# Patient Record
Sex: Female | Born: 1995
Health system: Southern US, Community
[De-identification: ages and names within clinical notes are randomized; demographics above are authoritative.]

## PROBLEM LIST (undated history)

## (undated) DIAGNOSIS — G43909 Migraine, unspecified, not intractable, without status migrainosus: Secondary | ICD-10-CM

## (undated) HISTORY — DX: Migraine, unspecified, not intractable, without status migrainosus: G43.909

## (undated) HISTORY — PX: WISDOM TOOTH EXTRACTION: SHX21

---
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
## 2016-09-16 DIAGNOSIS — J988 Other specified respiratory disorders: Secondary | ICD-10-CM | POA: Diagnosis not present

## 2016-09-16 DIAGNOSIS — Z0000 Encounter for general adult medical examination without abnormal findings: Secondary | ICD-10-CM | POA: Diagnosis not present

## 2016-09-18 ENCOUNTER — Ambulatory Visit
Admission: RE | Admit: 2016-09-18 | Discharge: 2016-09-18 | Disposition: A | Discharge status: Home / Self Care | Payer: 59 | Source: Ambulatory Visit | Attending: Pediatrics | Admitting: Pediatrics

## 2016-09-18 ENCOUNTER — Other Ambulatory Visit: Payer: Self-pay | Admitting: Pediatrics

## 2016-09-18 DIAGNOSIS — R05 Cough: Secondary | ICD-10-CM

## 2016-09-18 DIAGNOSIS — R059 Cough, unspecified: Secondary | ICD-10-CM

## 2016-09-18 DIAGNOSIS — R918 Other nonspecific abnormal finding of lung field: Secondary | ICD-10-CM | POA: Diagnosis not present

## 2016-09-18 DIAGNOSIS — R509 Fever, unspecified: Secondary | ICD-10-CM

## 2016-09-18 DIAGNOSIS — J069 Acute upper respiratory infection, unspecified: Secondary | ICD-10-CM | POA: Diagnosis not present

## 2016-09-22 MED FILL — CLINDAMYCIN-BENZOYL PEROX 1: 1-5 | 25 days supply | Qty: 25 | Fill #0

## 2016-10-17 ENCOUNTER — Other Ambulatory Visit: Payer: Self-pay | Admitting: Pediatrics

## 2016-10-17 ENCOUNTER — Ambulatory Visit
Admission: RE | Admit: 2016-10-17 | Discharge: 2016-10-17 | Disposition: A | Discharge status: Home / Self Care | Payer: 59 | Source: Ambulatory Visit | Attending: Pediatrics | Admitting: Pediatrics

## 2016-10-17 DIAGNOSIS — Z09 Encounter for follow-up examination after completed treatment for conditions other than malignant neoplasm: Secondary | ICD-10-CM

## 2016-10-17 DIAGNOSIS — J1100 Influenza due to unidentified influenza virus with unspecified type of pneumonia: Secondary | ICD-10-CM

## 2016-10-17 DIAGNOSIS — Z23 Encounter for immunization: Secondary | ICD-10-CM | POA: Diagnosis not present

## 2016-10-17 DIAGNOSIS — J189 Pneumonia, unspecified organism: Secondary | ICD-10-CM | POA: Diagnosis not present

## 2017-01-31 DIAGNOSIS — L638 Other alopecia areata: Secondary | ICD-10-CM | POA: Diagnosis not present

## 2017-01-31 DIAGNOSIS — L648 Other androgenic alopecia: Secondary | ICD-10-CM | POA: Diagnosis not present

## 2017-02-06 MED FILL — CLOBETASOL 0.05% SOLUTION: 0.05 | 20 days supply | Qty: 50 | Fill #0

## 2017-03-05 MED FILL — CLOBETASOL 0.05% SOLUTION: 0.05 | 20 days supply | Qty: 50 | Fill #1

## 2017-03-06 DIAGNOSIS — L638 Other alopecia areata: Secondary | ICD-10-CM | POA: Diagnosis not present

## 2017-04-03 MED FILL — CLOBETASOL 0.05% SOLUTION: 0.05 | 20 days supply | Qty: 50 | Fill #2

## 2017-04-06 DIAGNOSIS — L700 Acne vulgaris: Secondary | ICD-10-CM | POA: Diagnosis not present

## 2017-04-06 DIAGNOSIS — L638 Other alopecia areata: Secondary | ICD-10-CM | POA: Diagnosis not present

## 2017-04-06 MED FILL — ACZONE 7.5% GEL PUMP: 7.5 | 60 days supply | Qty: 60 | Fill #0

## 2017-08-03 DIAGNOSIS — L700 Acne vulgaris: Secondary | ICD-10-CM | POA: Diagnosis not present

## 2017-08-03 DIAGNOSIS — L659 Nonscarring hair loss, unspecified: Secondary | ICD-10-CM | POA: Diagnosis not present

## 2018-04-26 DIAGNOSIS — Z7189 Other specified counseling: Secondary | ICD-10-CM | POA: Diagnosis not present

## 2018-04-26 DIAGNOSIS — Z00129 Encounter for routine child health examination without abnormal findings: Secondary | ICD-10-CM | POA: Diagnosis not present

## 2018-04-26 DIAGNOSIS — Z713 Dietary counseling and surveillance: Secondary | ICD-10-CM | POA: Diagnosis not present

## 2018-12-10 DIAGNOSIS — Z124 Encounter for screening for malignant neoplasm of cervix: Secondary | ICD-10-CM | POA: Diagnosis not present

## 2018-12-10 DIAGNOSIS — Z01419 Encounter for gynecological examination (general) (routine) without abnormal findings: Secondary | ICD-10-CM | POA: Diagnosis not present

## 2019-01-09 ENCOUNTER — Telehealth (INDEPENDENT_AMBULATORY_CARE_PROVIDER_SITE_OTHER): Payer: Self-pay | Admitting: Pediatrics

## 2019-01-09 NOTE — Telephone Encounter (Signed)
°  Who's calling (name and relationship to patient) : Lindsay Copeland  Best contact number: [phone]  Provider they see: Dr. Sharene Skeans  Reason for call: Patient states she needs [place] to send her medical records to [facility], since she aged out of our office. I stated to her that she needed to complete a Release of Information document, and patient stated that she is away at school so would like it emailed to her. I agreed to email an empty form to her, at [email or web address] and asked her to fax back the completed form to [place]. Once received told patient we would fax her records to [phone].   PRESCRIPTION REFILL ONLY  Name of prescription:  Pharmacy:

## 2019-01-21 MED FILL — CEPHALEXIN 500 MG CAPSULE: 500 | 5 days supply | Qty: 15 | Fill #0

## 2019-02-25 MED FILL — BLISOVI FE 1/20 1-20 MG-MCG: 1-20 | 84 days supply | Qty: 84 | Fill #0

## 2019-04-16 NOTE — Telephone Encounter (Signed)
error 

## 2019-05-28 MED FILL — BLISOVI FE 1/20 1-20 MG-MCG: 1-20 | 84 days supply | Qty: 84 | Fill #1

## 2019-08-13 ENCOUNTER — Telehealth (INDEPENDENT_AMBULATORY_CARE_PROVIDER_SITE_OTHER): Payer: Self-pay | Admitting: Pediatrics

## 2019-08-13 DIAGNOSIS — G43009 Migraine without aura, not intractable, without status migrainosus: Secondary | ICD-10-CM

## 2019-08-13 MED FILL — BLISOVI FE 1/20 1-20 MG-MCG: 1-20 | 84 days supply | Qty: 84 | Fill #2

## 2019-08-13 NOTE — Telephone Encounter (Signed)
We have no records in the chart for this young woman which means that I have not seen her since March 2013.  She has migraine without aura.  She needs neurological evaluation and treatment.  I told her to call me back if there is any problem with this referral.  I spoke with someone in [place]'s office to let them know that we have no records to send them and that she just needs to be seen as a new patient with migraines.

## 2019-08-13 NOTE — Telephone Encounter (Signed)
°  Who's calling (name and relationship to patient) : Onesty (patient)  Best contact number: [phone]  Provider they see: Gaynell Face   Reason for call: Patient called and need a referral sent to [facility] ph number [phone] to transfer care.  Her ROI was sent in March.      PRESCRIPTION REFILL ONLY  Name of prescription:  Pharmacy:

## 2019-08-13 NOTE — Telephone Encounter (Signed)
Referral was sent in March. They tried to contact patient was unable to reach her

## 2019-08-20 ENCOUNTER — Encounter: Payer: Self-pay | Admitting: Neurology

## 2019-08-30 ENCOUNTER — Emergency Department (HOSPITAL_COMMUNITY): Payer: 59

## 2019-08-30 ENCOUNTER — Encounter (HOSPITAL_COMMUNITY): Payer: Self-pay

## 2019-08-30 ENCOUNTER — Emergency Department (HOSPITAL_COMMUNITY)
Admission: EM | Admit: 2019-08-30 | Discharge: 2019-08-30 | Disposition: A | Discharge status: Home / Self Care | Payer: 59 | Attending: Emergency Medicine | Admitting: Emergency Medicine

## 2019-08-30 ENCOUNTER — Other Ambulatory Visit: Payer: Self-pay

## 2019-08-30 DIAGNOSIS — R519 Headache, unspecified: Secondary | ICD-10-CM | POA: Diagnosis not present

## 2019-08-30 DIAGNOSIS — G43109 Migraine with aura, not intractable, without status migrainosus: Secondary | ICD-10-CM | POA: Diagnosis not present

## 2019-08-30 DIAGNOSIS — R200 Anesthesia of skin: Secondary | ICD-10-CM | POA: Diagnosis not present

## 2019-08-30 DIAGNOSIS — G43909 Migraine, unspecified, not intractable, without status migrainosus: Secondary | ICD-10-CM | POA: Diagnosis present

## 2019-08-30 LAB — CBC
HCT: 42 % (ref 36.0–46.0)
Hemoglobin: 13.9 g/dL (ref 12.0–15.0)
MCH: 30.2 pg (ref 26.0–34.0)
MCHC: 33.1 g/dL (ref 30.0–36.0)
MCV: 91.3 fL (ref 80.0–100.0)
Platelets: 285 10*3/uL (ref 150–400)
RBC: 4.6 MIL/uL (ref 3.87–5.11)
RDW: 11.4 % — ABNORMAL LOW (ref 11.5–15.5)
WBC: 13.7 10*3/uL — ABNORMAL HIGH (ref 4.0–10.5)
nRBC: 0 % (ref 0.0–0.2)

## 2019-08-30 LAB — BASIC METABOLIC PANEL
Anion gap: 14 (ref 5–15)
BUN: 13 mg/dL (ref 6–20)
CO2: 21 mmol/L — ABNORMAL LOW (ref 22–32)
Calcium: 9.4 mg/dL (ref 8.9–10.3)
Chloride: 103 mmol/L (ref 98–111)
Creatinine, Ser: 0.89 mg/dL (ref 0.44–1.00)
GFR calc Af Amer: >60 mL/min (ref >60)
GFR calc non Af Amer: >60 mL/min (ref >60)
Glucose, Bld: 105 mg/dL — ABNORMAL HIGH (ref 70–99)
Potassium: 3.6 mmol/L (ref 3.5–5.1)
Sodium: 138 mmol/L (ref 135–145)

## 2019-08-30 LAB — I-STAT BETA HCG BLOOD, ED (MC, WL, AP ONLY): I-stat hCG, quantitative: <5 m[IU]/mL (ref <5)

## 2019-08-30 MED ORDER — TOPIRAMATE 25 MG PO TABS: 25.0000 mg | ORAL_TABLET | Freq: Two times a day (BID) | ORAL | 0 refills | Status: DC

## 2019-08-30 MED ORDER — GADOBUTROL 1 MMOL/ML IV SOLN
5.0000 mL | Freq: Once | INTRAVENOUS | Status: AC | PRN
  Administered 2019-08-30: 5 mL via INTRAVENOUS

## 2019-08-30 MED ORDER — METOCLOPRAMIDE HCL 5 MG/ML IJ SOLN
10.0000 mg | INTRAMUSCULAR | Status: AC
  Administered 2019-08-30: 07:00:00 10 mg via INTRAVENOUS
  Filled 2019-08-30: qty 2

## 2019-08-30 MED ORDER — SODIUM CHLORIDE 0.9 % IV BOLUS
1000.0000 mL | Freq: Once | INTRAVENOUS | Status: AC
  Administered 2019-08-30: 06:00:00 1000 mL via INTRAVENOUS

## 2019-08-30 MED FILL — TOPIRAMATE 25 MG TABLET: 25 | 20 days supply | Qty: 40 | Fill #0

## 2019-08-30 NOTE — ED Triage Notes (Addendum)
Pt arrives to ED with c/o of migraines. Pt states she has has not had migraines in 4-5 years and has had about 10 in past year. 3 migraines in past 9 days. Has vision changes in both eyes. Confusion for approx 20 mins this am around 3. R arm numbness/heaviness. Pt states she could not figure out how to get around in her own home, but she knew she was in her house. Head currently hurting on both sides and has nausea w/ no vomiting. Pt currently taking birth control. Took 650mg  tylenol around 0315 and 2 magnilife migraine relief at same time.

## 2019-08-30 NOTE — ED Notes (Signed)
Patient transported to MRI 

## 2019-08-30 NOTE — ED Provider Notes (Signed)
[facility] EMERGENCY DEPARTMENT Provider Note   CSN: [id number] Arrival date & time: 08/30/19  8177     History   Chief Complaint Chief Complaint  Patient presents with   Migraine    HPI Lindsay Copeland is a 23 y.o. female.    23 year old female presents to the emergency department for evaluation of migraine headaches.  With regards to symptoms tonight, patient awoke at around 0315 for a headache.  She describes the pain as sharp.  It began on one side of her head, but she is unsure of which side.  Later became more global.  Initially took some Tylenol followed by over-the-counter holistic migraine relief tablets.  Pain was initially rated at 8/10.  She had some paresthesias and heaviness of her right hand and arm as well as some numbness of her lips.  Symptoms further associated with photophobia as well as nausea.  The patient became concerned when she had an episode of confusion which lasted approximately 15 minutes.  She states that she was unaware of where she was in her house; had trouble placing her surroundings.  She currently states that her headache has improved and rates it at 2/10.  She is no longer acutely confused.  She does have a history of migraines which were evaluated as early as 2012.  Her symptoms resolved for 4 to 5 years and began to recur in January.  She is pending follow-up with neurology this coming week.  States that it is not unusual for her to have an aura with her migraines.  She states that proceeding symptoms typically include numbness in her extremities and face with vision changes.  Her vision changes were initially characterized as double vision, but she has had a few episodes over the past week where she experiences homonymous hemianopsia.  This can be either left or right sided.  She has not had episodes of confusion with headaches in the past.  No recent head injury or trauma, fevers, vomiting, numbness or tingling of the lower  extremities, extremity weakness, complete vision loss, tinnitus or hearing loss, facial drooping, slurred speech.  Began taking OCPs in February.   The history is provided by the patient. No language interpreter was used.  Migraine    History reviewed. No pertinent past medical history.  There are no active problems to display for this patient.   ** The histories are not reviewed yet. Please review them in the "History" navigator section and refresh this SmartLink.   OB History   No obstetric history on file.      Home Medications    Prior to Admission medications   Not on File    Family History History reviewed. No pertinent family history.  Social History Social History   Tobacco Use   Smoking status: Not on file  Substance Use Topics   Alcohol use: Not on file   Drug use: Not on file     Allergies   Patient has no known allergies.   Review of Systems Review of Systems Ten systems reviewed and are negative for acute change, except as noted in the HPI.    Physical Exam Updated Vital Signs BP (!) 167/98 (BP Location: Left Arm)    Pulse (!) 102    Temp 97.9 F (36.6 C) (Oral)    Resp (!) 21    Wt 47.3 kg    LMP 08/15/2019 (Exact Date)    SpO2 100%   Physical Exam Vitals signs and nursing  note reviewed.  Constitutional:      General: She is not in acute distress.    Appearance: She is well-developed. She is not diaphoretic.     Comments: Thin frame. Nontoxic appearing  HENT:     Head: Normocephalic and atraumatic.     Mouth/Throat:     Mouth: Mucous membranes are moist.     Comments: Symmetric rise of the uvula with phonation Eyes:     General: No scleral icterus.    Extraocular Movements: Extraocular movements intact.     Conjunctiva/sclera: Conjunctivae normal.     Pupils: Pupils are equal, round, and reactive to light.     Comments: No nystagmus. No visual field deficits.  Neck:     Musculoskeletal: Normal range of motion.  Pulmonary:      Effort: Pulmonary effort is normal. No respiratory distress.     Comments: Respirations even and unlabored Musculoskeletal: Normal range of motion.  Skin:    General: Skin is warm and dry.     Coloration: Skin is not pale.     Findings: No erythema or rash.  Neurological:     Mental Status: She is alert and oriented to person, place, and time.     Comments: GCS 15. Speech is goal oriented. No cranial nerve deficits appreciated; symmetric eyebrow raise, no facial drooping, tongue midline. Patient has equal grip strength bilaterally with 5/5 strength against resistance in all major muscle groups bilaterally. Sensation to light touch intact. Patient moves extremities without ataxia. Normal finger-nose-finger.   Psychiatric:        Behavior: Behavior normal.      ED Treatments / Results  Labs (all labs ordered are listed, but only abnormal results are displayed) Labs Reviewed  BASIC METABOLIC PANEL  CBC  I-STAT BETA HCG BLOOD, ED ([facility], [facility], [facility] ONLY)    EKG None  Radiology No results found.  Procedures Procedures (including critical care time)  Medications Ordered in ED Medications  metoCLOPramide (REGLAN) injection 10 mg (has no administration in time range)  sodium chloride 0.9 % bolus 1,000 mL (1,000 mLs Intravenous New Bag/Given 08/30/19 0615)     Initial Impression / Assessment and Plan / ED Course  I have reviewed the triage vital signs and the nursing notes.  Pertinent labs & imaging results that were available during my care of the patient were reviewed by me and considered in my medical decision making (see chart for details).        36:85 AM 23 year old female presents to the emergency department for evaluation of migraine headaches.  Headache has improved since taking over-the-counter medications.  Present neurologic exam is nonfocal.  Patient with associated numbness in her face and lips tonight as well as her right arm and hand with "heaviness".  Patient also  reporting associated photophobia and nausea.  Reports an episode of confusion as well lasting ~15-20 minutes.  The patient has been experiencing recurrent headaches since January with 5 in the past 10 days.  Patient reporting an aura with her migraines which typically includes paresthesias as well as visual field defects.  Upon chart review, patient had MRI in 2012 concerning for possible MS.  Upon speaking with patient and her mother, this was not further investigated as the patient's headaches resolved for a number of years.  She has plans to reestablish care with neurology and an appointment this coming week.  Plan for labs and imaging for further evaluation of symptoms given new onset of acute confusion with migraine tonight.  6:15 AM Case discussed with Dr. Laurence SlateAroor on-call for neurology.  He recommends foregoing CT for MRI with and without contrast given prior imaging readings.  Also recommends that the patient be on some migraine preventative medication given her frequency and preceding aura; suggests Topamax if imaging reassuring.  Patient to be signed out to HardestyRobinson, NP at shift change pending MRI results.   Final Clinical Impressions(s) / ED Diagnoses   Final diagnoses:  Bad headache    ED Discharge Orders    None       Antony MaduraHumes, Chellie Vanlue, PA-C 08/30/19 [id number]    Dione BoozeGlick, David, MD 08/30/19 [phone]

## 2019-08-30 NOTE — ED Notes (Signed)
Pt given sprite and crackers  

## 2019-08-30 NOTE — ED Notes (Signed)
MRI states that the pt should be able to get her test done in hopefully another hour.

## 2019-08-30 NOTE — ED Notes (Signed)
Pt states that the vision changes and the numbness have subsided. Rates pain 1/10. Pt removed clothing and put on hospital gown. Denies any claustrophobia.

## 2019-08-30 NOTE — ED Notes (Signed)
Pt placed on continuous pulse ox

## 2019-08-30 NOTE — ED Notes (Signed)
ED Provider at bedside. 

## 2019-08-30 NOTE — ED Notes (Signed)
Pt up to restroom.

## 2019-09-12 MED FILL — JUNEL FE 1.5/30 TABLET: 1.5-30 | 84 days supply | Qty: 84 | Fill #0

## 2019-09-16 NOTE — Progress Notes (Addendum)
NEUROLOGY CONSULTATION NOTE  Lindsay Copeland MRN: [id number] DOB: Oct 11, 1996  Referring provider: Ellison CarwinWilliam Hickling, MD Primary care provider: No PCP  Reason for consult:  migraines  HISTORY OF PRESENT ILLNESS: Lindsay Copeland is a 23 year old female who presents for migraines.  History supplemented by ED note.  Accompanied by mother via speaker phone  She started experiencing migraines when she was in middle school.  Typically they have been preceded by numbness and tingling of her face and extremities as well as double vision.  Headaches are associated with nausea, vomiting, photophobia, phonophobia and unilateral weakness.  They subsequently resolved in 11th grade.    However, they started to recur in January 2020.  She had one migraine in January.  She started birth control (Blisovi Fe) in February.  Now, she doesn't have double vision but she has now been having blind spots in either eye or flashing spots and zigzag lines for 30-45 minutes prior to headache onset.  It now appears to have dysconjugate gaze (one eye appears not straight).  They typically have been lasting several hours (usually goes to sleep) and has occurred 14 times this past year (most frequent in October - 6 times).  She has a postdrome lasting 1 to 2 days described as head pressure.  [facility] after waking up in the middle of the night with a migraine.  This migraine was different, as it was accompanied by 15 minutes of confusion in which she felt disoriented in her house. MRI of brain and cervical spine with and without contrast were personally reviewed and demonstrated periventricular T2 hyperintensities in the cerebral white matter, stable compared to prior imaging from 2012, and no abnormalities of the cervical spine.  She was treated in the ED with IV Reglan and discharged on topiramate. After headache broke, she had trouble with concentration, processing thoughts and  word-finding difficulty for 2 weeks and still not back to baseline. 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  She passed her clinicals but now studying for her boards.  She hasn't had a migraine since the ED visit but has has reported dysconjugate gaze.  Triggers:  Only one headache associated with menses. Relieving factors:  sleep  Current NSAIDS:  ibuprofen Current analgesics:  acetaminophen Current triptans:  none Current ergotamine:  none Current anti-emetic:  none Current muscle relaxants:  none Current anti-anxiolytic:  none Current sleep aide:  none Current Antihypertensive medications:  none Current Antidepressant medications:  none Current Anticonvulsant medications:  topiramate 25mg  twice daily. Current anti-CGRP:  none Current Vitamins/Herbal/Supplements:  Migraine relieve supplement combination PRN Current Antihistamines/Decongestants:  none Other therapy:  none Hormone/birth control:  Loestrin Fe (switched a couple of weeks ago)  Past NSAIDS:  none Past analgesics:  tramadol Past abortive triptans:  none Past abortive ergotamine:  none Past muscle relaxants:  none Past anti-emetic:  Zofran Past antihypertensive medications:  none Past antidepressant medications:  none Past anticonvulsant medications:  none Past anti-CGRP:  none Past vitamins/Herbal/Supplements:  none Past antihistamines/decongestants:  none Other past therapies:  none  Caffeine:  Coffee not daily Diet:  Needs to increase water intake Exercise:  Walker Depression:  no; Anxiety:  no Other pain:  no Sleep hygiene:  okay Family history of headache:  no  PAST MEDICAL HISTORY: History reviewed. No pertinent past medical history.   PAST SURGICAL HISTORY: History reviewed. No pertinent surgical history.  MEDICATIONS: Outpatient Encounter  Medications as of 09/19/2019  Medication Sig   norethindrone-ethinyl estradiol (LOESTRIN FE) 1-20 MG-MCG  tablet Take 1 tablet by mouth daily.   topiramate (TOPAMAX) 25 MG tablet Take 1 tablet (25 mg total) by mouth 2 (two) times daily.   [DISCONTINUED] BLISOVI FE 1/20 1-20 MG-MCG tablet    No facility-administered encounter medications on file as of 09/19/2019.     ALLERGIES: No Known Allergies  FAMILY HISTORY: Family History  Problem Relation Age of Onset   Healthy Mother    Bone cancer Father    Healthy Sister    Healthy Brother     SOCIAL HISTORY: Social History   Socioeconomic History   Marital status: Single    Spouse name: Not on file   Number of children: Not on file   Years of education: Not on file   Highest education level: Not on file  Occupational History   Not on file  Social Needs   Financial resource strain: Not on file   Food insecurity    Worry: Not on file    Inability: Not on file   Transportation needs    Medical: Not on file    Non-medical: Not on file  Tobacco Use   Smoking status: Not on file  Substance and Sexual Activity   Alcohol use: Not on file   Drug use: Not on file   Sexual activity: Not on file  Lifestyle   Physical activity    Days per week: Not on file    Minutes per session: Not on file   Stress: Not on file  Relationships   Social connections    Talks on phone: Not on file    Gets together: Not on file    Attends religious service: Not on file    Active member of club or organization: Not on file    Attends meetings of clubs or organizations: Not on file    Relationship status: Not on file   Intimate partner violence    Fear of current or ex partner: Not on file    Emotionally abused: Not on file    Physically abused: Not on file    Forced sexual activity: Not on file  Other Topics Concern   Not on file  Social History Narrative   Not on file    REVIEW OF SYSTEMS: Constitutional: No fevers, chills, or sweats, no generalized fatigue, change in appetite Eyes: No visual changes, double vision,  eye pain Ear, nose and throat: No hearing loss, ear pain, nasal congestion, sore throat Cardiovascular: No chest pain, palpitations Respiratory:  No shortness of breath at rest or with exertion, wheezes GastrointestinaI: No nausea, vomiting, diarrhea, abdominal pain, fecal incontinence Genitourinary:  No dysuria, urinary retention or frequency Musculoskeletal:  No neck pain, back pain Integumentary: No rash, pruritus, skin lesions Neurological: as above Psychiatric: No depression, insomnia, anxiety Endocrine: No palpitations, fatigue, diaphoresis, mood swings, change in appetite, change in weight, increased thirst Hematologic/Lymphatic:  No purpura, petechiae. Allergic/Immunologic: no itchy/runny eyes, nasal congestion, recent allergic reactions, rashes  PHYSICAL EXAM: Blood pressure (!) 174/94, pulse (!) 118, height 5\' 7"  (1.702 m), weight 100 lb (45.4 kg), SpO2 100 %. General: No acute distress.  Patient appears well-groomed.   Head:  Normocephalic/atraumatic Eyes:  fundi examined but not visualized Neck: supple, no paraspinal tenderness, full range of motion Back: No paraspinal tenderness Heart: regular rate and rhythm Lungs: Clear to auscultation bilaterally. Vascular: No carotid bruits. Neurological Exam: Mental status: alert and oriented to person,  place, and time, recent and remote memory intact, fund of knowledge intact, attention and concentration intact, speech fluent and not dysarthric, language intact. Cranial nerves: CN I: not tested CN II: pupils equal, round and reactive to light, visual fields intact CN III, IV, VI:  full range of motion, no nystagmus, no ptosis CN V: facial sensation intact CN VII: upper and lower face symmetric CN VIII: hearing intact CN IX, X: gag intact, uvula midline CN XI: sternocleidomastoid and trapezius muscles intact CN XII: tongue midline Bulk & Tone: normal, no fasciculations. Motor:  5/5 throughout  Sensation:  temperature and  vibration sensation intact. Deep Tendon Reflexes:  2+ throughout, toes downgoing.   Finger to nose testing:  Without dysmetria.   Heel to shin:  Without dysmetria.   Gait:  Normal station and stride.  Able to turn and tandem walk. Romberg negative.  IMPRESSION: Migraine with aura, without status migrainosus, not intractable Hemiplegic migraine Still notes some mild difficulty with concentration, which may be secondary to topiramate. Sinus tachycardia and elevated blood pressure  PLAN: 1.  For preventative management, topiramate 25mg  twice daily 2.  For abortive therapy, Nurtec 75mg .  Triptans are contraindicated given stroke-like symptoms of her migraines.  Zofran for nausea. 3.  Limit use of pain relievers to no more than 2 days out of week to prevent risk of rebound or medication-overuse headache. 4.  Keep headache diary 5.  Exercise, hydration, caffeine cessation, sleep hygiene, monitor for and avoid triggers 6.  Consider:  magnesium citrate 400mg  daily, riboflavin 400mg  daily, and coenzyme Q10 100mg  three times daily 7. Always keep in mind that currently taking a hormone or birth control may be a possible trigger or aggravating factor for migraine. 8. Advised to follow up with PCP regarding pulse and blood pressure 9. Follow up 4 months   Thank you for allowing me to take part in the care of this patient.  , DO

## 2019-09-19 ENCOUNTER — Encounter: Payer: Self-pay | Admitting: Neurology

## 2019-09-19 ENCOUNTER — Telehealth: Payer: Self-pay | Admitting: Neurology

## 2019-09-19 ENCOUNTER — Ambulatory Visit (INDEPENDENT_AMBULATORY_CARE_PROVIDER_SITE_OTHER): Payer: 59 | Admitting: Neurology

## 2019-09-19 ENCOUNTER — Other Ambulatory Visit: Payer: Self-pay

## 2019-09-19 VITALS — BP 174/94 | HR 118 | Ht 67.0 in | Wt 100.0 lb

## 2019-09-19 DIAGNOSIS — G43119 Migraine with aura, intractable, without status migrainosus: Secondary | ICD-10-CM | POA: Diagnosis not present

## 2019-09-19 DIAGNOSIS — R030 Elevated blood-pressure reading, without diagnosis of hypertension: Secondary | ICD-10-CM

## 2019-09-19 DIAGNOSIS — R000 Tachycardia, unspecified: Secondary | ICD-10-CM

## 2019-09-19 MED ORDER — NURTEC 75 MG PO TBDP: 1.0000 | ORAL_TABLET | Freq: Every day | ORAL | 0 refills | Status: DC | PRN

## 2019-09-19 MED ORDER — TOPIRAMATE 25 MG PO TABS: 25.0000 mg | ORAL_TABLET | Freq: Two times a day (BID) | ORAL | 3 refills | Status: DC

## 2019-09-19 MED ORDER — ONDANSETRON 4 MG PO TBDP: 4.0000 mg | ORAL_TABLET | Freq: Three times a day (TID) | ORAL | 3 refills | Status: DC | PRN

## 2019-09-19 MED FILL — ONDANSETRON ODT 4 MG TABLET: 4 | 7 days supply | Qty: 20 | Fill #0

## 2019-09-19 MED FILL — TOPIRAMATE 25 MG TABLET: 25 | 30 days supply | Qty: 60 | Fill #0

## 2019-09-19 NOTE — Telephone Encounter (Signed)
Pt aware no change continue same dose And that Rx was sent to pharmacy  Update phone number  [phone]

## 2019-09-19 NOTE — Addendum Note (Signed)
Addended byTomi Likens, ADAM R on: 09/19/2019 11:30 AM   Modules accepted: Orders

## 2019-09-19 NOTE — Telephone Encounter (Signed)
Per office note from today take topiramate 25 mg at bedtime.  Decrease dose and send to pharmacy? Patient is requesting refills sent to local pharmacy

## 2019-09-19 NOTE — Telephone Encounter (Signed)
I sent refill to pharmacy (25mg  twice daily)

## 2019-09-19 NOTE — Patient Instructions (Signed)
1.  Take topiramate 25mg  at bedtime 2.  At earliest onset of migraine, take Nurtec. No more than one tablet in 24 hours 3.  Take ondansetron for nausea 4.  Limit use of pain relievers to no more than 2 days out of week to prevent risk of rebound or medication-overuse headache. 5.  Keep headache diary 6.  Follow up with PCP regarding blood pressure and heart rate 7.  Follow up in 4 months.

## 2019-09-19 NOTE — Telephone Encounter (Signed)
No change in dose.  Continue 25mg  twice daily.

## 2019-09-19 NOTE — Telephone Encounter (Signed)
Patient was seen today but has questions about her topomax 25 MG prescription. She said she has been taking it twice a day but she thought she heard Dr. Tomi Likens say she will now be taking it once a day.  [place]

## 2019-10-07 ENCOUNTER — Other Ambulatory Visit: Payer: Self-pay

## 2019-10-07 DIAGNOSIS — Z20822 Contact with and (suspected) exposure to covid-19: Secondary | ICD-10-CM

## 2019-10-09 LAB — NOVEL CORONAVIRUS, NAA: SARS-CoV-2, NAA: NOT DETECTED

## 2019-10-17 MED FILL — TOPIRAMATE 25 MG TABLET: 25 | 30 days supply | Qty: 60 | Fill #1

## 2019-11-03 DIAGNOSIS — R509 Fever, unspecified: Secondary | ICD-10-CM | POA: Diagnosis not present

## 2019-11-03 DIAGNOSIS — R05 Cough: Secondary | ICD-10-CM | POA: Diagnosis not present

## 2019-11-03 DIAGNOSIS — Z20828 Contact with and (suspected) exposure to other viral communicable diseases: Secondary | ICD-10-CM | POA: Diagnosis not present

## 2019-11-24 MED FILL — TOPIRAMATE 25 MG TABLET: 25 | 30 days supply | Qty: 60 | Fill #2

## 2019-12-10 MED FILL — JUNEL FE 1.5/30 TABLET: 1.5-30 | 84 days supply | Qty: 84 | Fill #1

## 2020-01-30 MED FILL — TOPIRAMATE 25 MG TABLET: 25 | 30 days supply | Qty: 60 | Fill #3

## 2020-02-02 NOTE — Progress Notes (Signed)
NEUROLOGY FOLLOW UP OFFICE NOTE  Lindsay Copeland [id number]  HISTORY OF PRESENT ILLNESS: Lindsay Copeland is a 24 year old female who follows up for migraines.  UPDATE: Since last visit in November, she has had only one migraine, preceded by visual aura.  She took a Nurtec and the headache resolved after a couple of hours.  No postdrome fatigue.  About 2 months ago, she started taking only the bedtime dose of topiramate because she just kept forgetting to take the morning dose.  No headache recurrence.  No hemiplegic migraine  Current NSAIDS:  ibuprofen Current analgesics:  acetaminophen Current triptans:  none Current ergotamine:  none Current anti-emetic:  none Current muscle relaxants:  none Current anti-anxiolytic:  none Current sleep aide:  none Current Antihypertensive medications:  none Current Antidepressant medications:  none Current Anticonvulsant medications:  topiramate 25mg  at bedtime. Current anti-CGRP:  Nurtec Current Vitamins/Herbal/Supplements:  Migraine relieve supplement combination PRN Current Antihistamines/Decongestants:  none Other therapy:  none Hormone/birth control:  Junel Fe  Caffeine:  Coffee not daily Diet:  Needs to increase water intake Exercise:  Walker Depression:  no; Anxiety:  no Other pain:  no Sleep hygiene:  okay  HISTORY: She started experiencing migraines when she was in middle school.  Typically they have been preceded by numbness and tingling of her face and extremities as well as double vision.  Headaches are associated with nausea, vomiting, photophobia, phonophobia and unilateral weakness.  They subsequently resolved in 11th grade.    However, they started to recur in January 2020.  She had one migraine in January.  She started birth control (Blisovi Fe) in February.  Now, she doesn't have double vision but she has now been having blind spots in either eye or flashing spots and zigzag lines for 30-45 minutes prior to  headache onset.  It now appears to have dysconjugate gaze (one eye appears not straight).  They typically have been lasting several hours (usually goes to sleep) and has occurred 14 times this past year (most frequent in October - 6 times).  She has a postdrome lasting 1 to 2 days described as head pressure.  She presented to the [facility] ED on 08/30/2019 after waking up in the middle of the night with a migraine.  This migraine was different, as it was accompanied by 15 minutes of confusion in which she felt disoriented in her house. MRI of brain and cervical spine with and without contrast were personally reviewed and demonstrated periventricular T2 hyperintensities in the cerebral white matter, stable compared to prior imaging from 2012, and no abnormalities of the cervical spine.  She was treated in the ED with IV Reglan and discharged on topiramate. After headache broke, she had trouble with concentration, processing thoughts and word-finding difficulty for 2 weeks and still not back to baseline. 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  She passed her clinicals but now studying for her boards.  She hasn't had a migraine since the ED visit but has has reported dysconjugate gaze.  Triggers:  Only one headache associated with menses. Relieving factors:  sleep  Past NSAIDS:  none Past analgesics:  tramadol Past abortive triptans:  none Past abortive ergotamine:  none Past muscle relaxants:  none Past anti-emetic:  Zofran Past antihypertensive medications:  none Past antidepressant medications:  none Past anticonvulsant medications:  none Past anti-CGRP:  none Past vitamins/Herbal/Supplements:  none Past antihistamines/decongestants:  none Other past therapies:  none   Family history of headache:  no  PAST MEDICAL HISTORY: No past medical history on file.  MEDICATIONS: Current Outpatient Medications on File Prior to Visit  Medication  Sig Dispense Refill  . norethindrone-ethinyl estradiol (LOESTRIN FE) 1-20 MG-MCG tablet Take 1 tablet by mouth daily.    . ondansetron (ZOFRAN ODT) 4 MG disintegrating tablet Take 1 tablet (4 mg total) by mouth every 8 (eight) hours as needed for nausea or vomiting. 20 tablet 3  . Rimegepant Sulfate (NURTEC) 75 MG TBDP Take 1 tablet by mouth daily as needed. 3 tablet 0  . topiramate (TOPAMAX) 25 MG tablet Take 1 tablet (25 mg total) by mouth 2 (two) times daily. 60 tablet 3   No current facility-administered medications on file prior to visit.    ALLERGIES: No Known Allergies  FAMILY HISTORY: Family History  Problem Relation Age of Onset  . Healthy Mother   . Bone cancer Father   . Healthy Sister   . Healthy Brother     SOCIAL HISTORY: Social History   Socioeconomic History  . Marital status: Married    Spouse name: Not on file  . Number of children: 0  . Years of education: Not on file  . Highest education level: Bachelor's degree (e.g., BA, AB, BS)  Occupational History  . Not on file  Tobacco Use  . Smoking status: Never Smoker  . Smokeless tobacco: Never Used  Substance and Sexual Activity  . Alcohol use: Never  . Drug use: Never  . Sexual activity: Not on file  Other Topics Concern  . Not on file  Social History Narrative  . Not on file   Social Determinants of Health   Financial Resource Strain:   . Difficulty of Paying Living Expenses:   Food Insecurity:   . Worried About Programme researcher, broadcasting/film/video in the Last Year:   . Barista in the Last Year:   Transportation Needs:   . Freight forwarder (Medical):   Marland Kitchen Lack of Transportation (Non-Medical):   Physical Activity:   . Days of Exercise per Week:   . Minutes of Exercise per Session:   Stress:   . Feeling of Stress :   Social Connections:   . Frequency of Communication with Friends and Family:   . Frequency of Social Gatherings with Friends and Family:   . Attends Religious Services:   . Active  Member of Clubs or Organizations:   . Attends Banker Meetings:   Marland Kitchen Marital Status:   Intimate Partner Violence:   . Fear of Current or Ex-Partner:   . Emotionally Abused:   Marland Kitchen Physically Abused:   . Sexually Abused:     REVIEW OF SYSTEMS: Constitutional: No fevers, chills, or sweats, no generalized fatigue, change in appetite Eyes: No visual changes, double vision, eye pain Ear, nose and throat: No hearing loss, ear pain, nasal congestion, sore throat Cardiovascular: No chest pain, palpitations Respiratory:  No shortness of breath at rest or with exertion, wheezes GastrointestinaI: No nausea, vomiting, diarrhea, abdominal pain, fecal incontinence Genitourinary:  No dysuria, urinary retention or frequency Musculoskeletal:  No neck pain, back pain Integumentary: No rash, pruritus, skin lesions Neurological: as above Psychiatric: No depression, insomnia, anxiety Endocrine: No palpitations, fatigue, diaphoresis, mood swings, change in appetite, change in weight, increased thirst Hematologic/Lymphatic:  No purpura, petechiae. Allergic/Immunologic: no itchy/runny eyes, nasal congestion, recent allergic reactions, rashes  PHYSICAL EXAM: Blood pressure 124/81, pulse (!) 102, height 5\' 7"  (1.702  m), weight 97 lb (44 kg), SpO2 98 %. General: No acute distress.  Patient appears well-groomed.   Head:  Normocephalic/atraumatic Eyes:  Fundi examined but not visualized Neck: supple, no paraspinal tenderness, full range of motion Heart:  Regular rate and rhythm Lungs:  Clear to auscultation bilaterally Back: No paraspinal tenderness Neurological Exam: alert and oriented to person, place, and time. Attention span and concentration intact, recent and remote memory intact, fund of knowledge intact.  Speech fluent and not dysarthric, language intact.  CN II-XII intact. Bulk and tone normal, muscle strength 5/5 throughout.  Sensation to temperature and vibration intact.  Deep tendon  reflexes 2+ throughout, toes downgoing.  Finger to nose and heel to shin testing intact.  Gait normal, Romberg negative.  IMPRESSION: 1.  Migraine with aura, without status migrainosus, not intractable 2.  Hemiplegic migraine  PLAN: 1.  For preventative management, she will continue topiramate at 25mg  at bedtime.  If she should have increase in migraine frequency, we can increase dose to 50mg  at bedtime. 2.  For abortive therapy, Nurtec (filled).  Provided copay card. 3.  Limit use of pain relievers to no more than 2 days out of week to prevent risk of rebound or medication-overuse headache. 4.  Keep headache diary 5.  Exercise, hydration, caffeine cessation, sleep hygiene, monitor for and avoid triggers 6. Follow up 6 months.   , DO  CC:  , NP

## 2020-02-03 ENCOUNTER — Encounter: Payer: Self-pay | Admitting: Neurology

## 2020-02-03 ENCOUNTER — Ambulatory Visit (INDEPENDENT_AMBULATORY_CARE_PROVIDER_SITE_OTHER): Payer: No Typology Code available for payment source | Admitting: Neurology

## 2020-02-03 ENCOUNTER — Other Ambulatory Visit: Payer: Self-pay

## 2020-02-03 VITALS — BP 124/81 | HR 102 | Ht 67.0 in | Wt 97.0 lb

## 2020-02-03 DIAGNOSIS — G43409 Hemiplegic migraine, not intractable, without status migrainosus: Secondary | ICD-10-CM

## 2020-02-03 DIAGNOSIS — G43109 Migraine with aura, not intractable, without status migrainosus: Secondary | ICD-10-CM | POA: Diagnosis not present

## 2020-02-03 MED ORDER — NURTEC 75 MG PO TBDP: 1.0000 | ORAL_TABLET | Freq: Every day | ORAL | 11 refills | Status: DC | PRN

## 2020-02-03 NOTE — Patient Instructions (Signed)
1.  May continue to take topiramate 25mg  at bedtime.  If you feel like you are having increased migraines, contact me and we can increase to 50mg  at bedtime. 2.  Prescribed Nurtec.  Take 1 tablet as needed.  Maximum 1 tablet in 24 hours. 3.  Limit use of pain relievers to no more than 2 days out of week to prevent risk of rebound or medication-overuse headache. 4.  Keep headache diary 5.  Follow up in 6 months.

## 2020-02-09 ENCOUNTER — Other Ambulatory Visit (HOSPITAL_COMMUNITY): Payer: Self-pay | Admitting: Obstetrics & Gynecology

## 2020-02-18 ENCOUNTER — Ambulatory Visit: Payer: 59 | Admitting: Internal Medicine

## 2020-02-23 MED FILL — BLISOVI FE 1.5/30 1.5-30 MG: 1.5-30 | 84 days supply | Qty: 84 | Fill #0

## 2020-02-23 MED FILL — NURTEC 75 MG TBDP: 75 | 30 days supply | Qty: 8 | Fill #0

## 2020-02-24 NOTE — Progress Notes (Addendum)
  Letter received from [employer] Approval for Nrutec 75mg  approved for 12 fills.  02/26/2020-02/24/2021 as long as the patient is enrolled in current plan with insurance.    This request is approved for 16 tabs for 30 days.       Your PA has been faxed to the plan as a paper copy. Please contact the plan directly if you haven't received a determination in a typical timeframe.  You will be notified of the determination electronically and via fax. How do I know if the plan approved the PA?  Add Reminder to your Dashboard Remind me in:  5 business days Contact plan to follow up on [id number] Nurte

## 2020-04-12 ENCOUNTER — Other Ambulatory Visit: Payer: Self-pay | Admitting: Neurology

## 2020-04-12 MED FILL — TOPIRAMATE 25 MG TABLET: 25 | 30 days supply | Qty: 60 | Fill #0

## 2020-05-19 MED FILL — BLISOVI FE 1.5/30 1.5-30 MG: 1.5-30 | 84 days supply | Qty: 84 | Fill #1

## 2020-07-14 MED FILL — TOPIRAMATE 25 MG TABLET: 25 | 30 days supply | Qty: 60 | Fill #1

## 2020-07-23 MED FILL — TOPIRAMATE 25 MG TABLET: 25 | 30 days supply | Qty: 60 | Fill #1

## 2020-08-04 NOTE — Progress Notes (Signed)
NEUROLOGY FOLLOW UP OFFICE NOTE  Lindsay Copeland [id number]  HISTORY OF PRESENT ILLNESS: Lindsay Copeland is a 24 year old female who follows up for migraines.  UPDATE: Since last visit, no migraines but had an episode last week in which she may have had a migraine in her sleep.  She started to see spots.  She went to sleep and when she woke up, she didn't feel quite well but no headache.  Current NSAIDS:ibuprofen Current analgesics:acetaminophen Current triptans:none Current ergotamine:none Current anti-emetic:none Current muscle relaxants:none Current anti-anxiolytic:none Current sleep aide:none Current Antihypertensive medications:none Current Antidepressant medications:none Current Anticonvulsant medications:topiramate 25mg  at bedtime. Current anti-CGRP:Nurtec Current Vitamins/Herbal/Supplements:Migraine relieve supplement combination PRN Current Antihistamines/Decongestants:none Other therapy:none Hormone/birth control:Junel Fe  Caffeine:Coffee not daily Diet:Needs to increase water intake Exercise:Walker Depression:no; Anxiety:no Other pain:no Sleep hygiene:okay  HISTORY: She started experiencing migraineswhen she was in middle school. Typically they have been preceded by numbness and tingling of her face and extremities as well as double vision. Headaches are associated with nausea, vomiting, photophobia, phonophobia and unilateral weakness. They subsequently resolved in 11th grade.   However, they started to recur in January 2020. She had one migraine in January. She started birth control (Blisovi Fe) in February. Now, she doesn't have double vision but she has now been having blind spots in either eye or flashing spots and zigzag lines for 30-45 minutes prior to headache onset. It now appears to have dysconjugate gaze (one eye appears not straight). They typically have been lastingseveral hours  (usually goes to sleep)and has occurred 14 times this past year (most frequent in October - 6 times).She has a postdrome lasting 1 to 2 days described as head pressure.She presented to [facility] ED on 08/30/2019 after waking up in the middle of the night with a migraine. This migraine was different, as it was accompanied by 15 minutes of confusion in which she felt disoriented in her house. MRI of brain and cervical spine with and without contrast were personally reviewed and demonstrated periventricular T2 hyperintensities in the cerebral white matter, stable compared to prior imaging from 2012, and no abnormalities of the cervical spine. She was treated in the ED with IV Reglan and discharged on topiramate. After headache broke, she had trouble with concentration, processing thoughts and word-finding difficulty for 2 weeks and still not back to baseline. 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 She passed her clinicals but now studying for her boards. She hasn't had a migraine since the ED visit but has has reported dysconjugate gaze.  Triggers: Only one headache associated with menses. Relieving factors: sleep  Past NSAIDS:none Past analgesics:tramadol Past abortive triptans:none Past abortive ergotamine:none Past muscle relaxants:none Past anti-emetic:Zofran Past antihypertensive medications:none Past antidepressant medications:none Past anticonvulsant medications:none Past anti-CGRP:none Past vitamins/Herbal/Supplements:none Past antihistamines/decongestants:none Other past therapies:none   Family history of headache:no  PAST MEDICAL HISTORY: No past medical history on file.  MEDICATIONS: Current Outpatient Medications on File Prior to Visit  Medication Sig Dispense Refill  . JUNEL FE 1.5/30 1.5-30 MG-MCG tablet Take 1 tablet by mouth daily.    . ondansetron (ZOFRAN ODT) 4 MG  disintegrating tablet Take 1 tablet (4 mg total) by mouth every 8 (eight) hours as needed for nausea or vomiting. 20 tablet 3  . Rimegepant Sulfate (NURTEC) 75 MG TBDP Take 1 tablet by mouth daily as needed. 8 tablet 11  . topiramate (TOPAMAX) 25 MG tablet TAKE 1 TABLET BY MOUTH TWO TIMES DAILY  60 tablet 3   No current facility-administered medications on file prior to visit.    ALLERGIES: No Known Allergies  FAMILY HISTORY: Family History  Problem Relation Age of Onset  . Healthy Mother   . Bone cancer Father   . Healthy Sister   . Healthy Brother     SOCIAL HISTORY: Social History   Socioeconomic History  . Marital status: Married    Spouse name: Not on file  . Number of children: 0  . Years of education: Not on file  . Highest education level: Bachelor's degree (e.g., BA, AB, BS)  Occupational History  . Not on file  Tobacco Use  . Smoking status: Never Smoker  . Smokeless tobacco: Never Used  Vaping Use  . Vaping Use: Never used  Substance and Sexual Activity  . Alcohol use: Never  . Drug use: Never  . Sexual activity: Not on file  Other Topics Concern  . Not on file  Social History Narrative  . Not on file   Social Determinants of Health   Financial Resource Strain:   . Difficulty of Paying Living Expenses: Not on file  Food Insecurity:   . Worried About Programme researcher, broadcasting/film/video in the Last Year: Not on file  . Ran Out of Food in the Last Year: Not on file  Transportation Needs:   . Lack of Transportation (Medical): Not on file  . Lack of Transportation (Non-Medical): Not on file  Physical Activity:   . Days of Exercise per Week: Not on file  . Minutes of Exercise per Session: Not on file  Stress:   . Feeling of Stress : Not on file  Social Connections:   . Frequency of Communication with Friends and Family: Not on file  . Frequency of Social Gatherings with Friends and Family: Not on file  . Attends Religious Services: Not on file  . Active Member of  Clubs or Organizations: Not on file  . Attends Banker Meetings: Not on file  . Marital Status: Not on file  Intimate Partner Violence:   . Fear of Current or Ex-Partner: Not on file  . Emotionally Abused: Not on file  . Physically Abused: Not on file  . Sexually Abused: Not on file    PHYSICAL EXAM: Blood pressure (!) 136/92, pulse 96, resp. rate 18, height 5\' 7"  (1.702 m), weight 102 lb (46.3 kg), SpO2 99 %. General: No acute distress.  Patient appears well-groomed.   Head:  Normocephalic/atraumatic Eyes:  Fundi examined but not visualized Neurological Exam: alert and oriented to person, place, and time. Attention span and concentration intact, recent and remote memory intact, fund of knowledge intact.  Speech fluent and not dysarthric, language intact.  CN II-XII intact. Bulk and tone normal, muscle strength 5/5 throughout.  Sensation to light touch, temperature and vibration intact.  Deep tendon reflexes 2+ throughout, toes downgoing.  Finger to nose and heel to shin testing intact.  Gait normal, Romberg negative.  IMPRESSION: 1.  Migraine with aura, without status migrainosus, not intractable 2.  Hemiplegic migraine  PLAN: 1.  For preventative management, topiramate 25mg  at bedtime 2.  For abortive therapy, Nurtec 3.  Limit use of pain relievers to no more than 2 days out of week to prevent risk of rebound or medication-overuse headache. 4.  Keep headache diary 5.  Exercise, hydration, caffeine cessation, sleep hygiene, monitor for and avoid triggers 6.  Follow up one year   , DO  CC:  Lam, NP

## 2020-08-06 ENCOUNTER — Encounter: Payer: Self-pay | Admitting: Neurology

## 2020-08-06 ENCOUNTER — Ambulatory Visit (INDEPENDENT_AMBULATORY_CARE_PROVIDER_SITE_OTHER): Payer: No Typology Code available for payment source | Admitting: Neurology

## 2020-08-06 ENCOUNTER — Other Ambulatory Visit: Payer: Self-pay

## 2020-08-06 VITALS — BP 136/92 | HR 96 | Resp 18 | Ht 67.0 in | Wt 102.0 lb

## 2020-08-06 DIAGNOSIS — G43409 Hemiplegic migraine, not intractable, without status migrainosus: Secondary | ICD-10-CM

## 2020-08-06 DIAGNOSIS — G43109 Migraine with aura, not intractable, without status migrainosus: Secondary | ICD-10-CM | POA: Diagnosis not present

## 2020-08-06 MED FILL — BLISOVI FE 1.5/30 1.5-30 MG: 1.5-30 | 84 days supply | Qty: 84 | Fill #2

## 2020-08-06 NOTE — Patient Instructions (Signed)
°  1. Continue topiramate 25mg  at bedtime 2. Take Nurtec at earliest onset of headache.  Maximum 1 tablet in 24 hours. 3. Limit use of pain relievers to no more than 2 days out of the week.  These medications include acetaminophen, NSAIDs (ibuprofen/Advil/Motrin, naproxen/Aleve, triptans (Imitrex/sumatriptan), Excedrin, and narcotics.  This will help reduce risk of rebound headaches. 4. Be aware of common food triggers:  - Caffeine:  coffee, black tea, cola, Mt. Dew  - Chocolate  - Dairy:  aged cheeses (brie, blue, cheddar, gouda, [place], provolone, [place], Swiss, etc), chocolate milk, buttermilk, sour cream, limit eggs and yogurt  - Nuts, peanut butter  - Alcohol  - Cereals/grains:  FRESH breads (fresh bagels, sourdough, doughnuts), yeast productions  - Processed/canned/aged/cured meats (pre-packaged deli meats, hotdogs)  - MSG/glutamate:  soy sauce, flavor enhancer, pickled/preserved/marinated foods  - Sweeteners:  aspartame (Equal, Nutrasweet).  Sugar and Splenda are okay  - Vegetables:  legumes (lima beans, lentils, snow peas, fava beans, pinto peans, peas, garbanzo beans), sauerkraut, onions, olives, pickles  - Fruit:  avocados, bananas, citrus fruit (orange, lemon, grapefruit), mango  - Other:  Frozen meals, macaroni and cheese 5. Routine exercise 6. Stay adequately hydrated (aim for 64 oz water daily) 7. Keep headache diary 8. Maintain proper stress management 9. Maintain proper sleep hygiene 10. Do not skip meals 11. Consider supplements:  magnesium citrate 400mg  daily, riboflavin 400mg  daily, coenzyme Q10 100mg  three times daily.

## 2020-11-06 MED FILL — TOPIRAMATE 25 MG TABLET: 25 | 30 days supply | Qty: 60 | Fill #2

## 2020-11-13 ENCOUNTER — Ambulatory Visit
Admission: RE | Admit: 2020-11-13 | Discharge: 2020-11-13 | Disposition: A | Discharge status: Home / Self Care | Payer: No Typology Code available for payment source | Source: Ambulatory Visit | Attending: Emergency Medicine | Admitting: Emergency Medicine

## 2020-11-13 ENCOUNTER — Other Ambulatory Visit: Payer: Self-pay

## 2020-11-13 VITALS — BP 150/100 | HR 115 | Temp 97.9°F | Resp 18

## 2020-11-13 DIAGNOSIS — Z1152 Encounter for screening for COVID-19: Secondary | ICD-10-CM

## 2020-11-13 DIAGNOSIS — Z20822 Contact with and (suspected) exposure to covid-19: Secondary | ICD-10-CM

## 2020-11-13 DIAGNOSIS — R058 Other specified cough: Secondary | ICD-10-CM | POA: Diagnosis not present

## 2020-11-13 MED ORDER — FLUTICASONE PROPIONATE 50 MCG/ACT NA SUSP: 1.0000 | Freq: Every day | NASAL | 0 refills | Status: DC

## 2020-11-13 MED ORDER — CETIRIZINE HCL 10 MG PO TABS: 10.0000 mg | ORAL_TABLET | Freq: Every day | ORAL | 0 refills | Status: DC

## 2020-11-13 MED ORDER — BENZONATATE 100 MG PO CAPS: 100.0000 mg | ORAL_CAPSULE | Freq: Three times a day (TID) | ORAL | 0 refills | Status: DC

## 2020-11-13 NOTE — ED Provider Notes (Addendum)
[name]    CSN: [id number] Arrival date & time: 11/13/20  1353      History   Chief Complaint Chief Complaint  Patient presents with  . Cough  . Nasal Congestion    HPI Lindsay Copeland is a 25 y.o. female  History was provided by the patient. Lindsay Copeland is a 25 y.o. female who presents for evaluation of symptoms of a URI. Symptoms include dry cough, nasal blockage, post nasal drip, sinus and nasal congestion and sore throat. Onset of symptoms was Thursday: worsening through yesterday, better today. Associated symptoms include no  fever.  She is drinking plenty of fluids. Evaluation to date: none. Treatment to date: cough suppressants.  Multiple family members had covid w/in last 2 wks. The following portions of the patient's history were reviewed and updated as appropriate: allergies, current medications, past family history, past medical history, past social history, past surgical history and problem list.     Past Medical History:  Diagnosis Date  . Migraines     There are no problems to display for this patient.   History reviewed. No pertinent surgical history.  OB History   No obstetric history on file.      Home Medications    Prior to Admission medications   Medication Sig Start Date End Date Taking? Authorizing Provider  benzonatate (TESSALON) 100 MG capsule Take 1 capsule (100 mg total) by mouth every 8 (eight) hours. 11/13/20  Yes Hall-Potvin, [place], PA-C  cetirizine (ZYRTEC ALLERGY) 10 MG tablet Take 1 tablet (10 mg total) by mouth daily. 11/13/20  Yes Hall-Potvin, [place], PA-C  fluticasone (FLONASE) 50 MCG/ACT nasal spray Place 1 spray into both nostrils daily. 11/13/20  Yes Hall-Potvin, [place], PA-C  JUNEL FE 1.5/30 1.5-30 MG-MCG tablet Take 1 tablet by mouth daily. 12/09/19   [provider]  ondansetron (ZOFRAN ODT) 4 MG disintegrating tablet Take 1 tablet (4 mg total) by mouth every 8 (eight) hours  as needed for nausea or vomiting. 09/19/19   Drema Dallas, DO  Rimegepant Sulfate (NURTEC) 75 MG TBDP Take 1 tablet by mouth daily as needed. 02/03/20   Everlena Cooper, Adam R, DO  topiramate (TOPAMAX) 25 MG tablet TAKE 1 TABLET BY MOUTH TWO TIMES DAILY 04/12/20   Drema Dallas, DO    Family History Family History  Problem Relation Age of Onset  . Healthy Mother   . Bone cancer Father   . Healthy Sister   . Healthy Brother     Social History Social History   Tobacco Use  . Smoking status: Never Smoker  . Smokeless tobacco: Never Used  Vaping Use  . Vaping Use: Never used  Substance Use Topics  . Alcohol use: Never  . Drug use: Never     Allergies   Patient has no known allergies.   Review of Systems Review of Systems  Constitutional: Negative for fatigue and fever.  HENT: Positive for congestion. Negative for dental problem, ear pain, facial swelling, hearing loss, sinus pain, sore throat, trouble swallowing and voice change.   Eyes: Negative for photophobia, pain and visual disturbance.  Respiratory: Positive for cough. Negative for shortness of breath.   Cardiovascular: Negative for chest pain and palpitations.  Gastrointestinal: Negative for diarrhea and vomiting.  Musculoskeletal: Negative for arthralgias and myalgias.  Neurological: Negative for dizziness and headaches.     Physical Exam Triage Vital Signs ED Triage Vitals  Enc Vitals Group     BP  Pulse      Resp      Temp      Temp src      SpO2      Weight      Height      Head Circumference      Peak Flow      Pain Score      Pain Loc      Pain Edu?      Excl. in GC?    No data found.  Updated Vital Signs BP (!) 150/100 (BP Location: Left Arm)   Pulse (!) 115   Temp 97.9 F (36.6 C)   Resp 18   LMP 10/20/2020   SpO2 99%   Visual Acuity Right Eye Distance:   Left Eye Distance:   Bilateral Distance:    Right Eye Near:   Left Eye Near:    Bilateral Near:     Physical  Exam Constitutional:      General: She is not in acute distress.    Appearance: She is not ill-appearing or diaphoretic.  HENT:     Head: Normocephalic and atraumatic.     Right Ear: Tympanic membrane and ear canal normal.     Left Ear: Tympanic membrane and ear canal normal.     Mouth/Throat:     Mouth: Mucous membranes are moist.     Pharynx: Oropharynx is clear. No oropharyngeal exudate or posterior oropharyngeal erythema.  Eyes:     General: No scleral icterus.    Conjunctiva/sclera: Conjunctivae normal.     Pupils: Pupils are equal, round, and reactive to light.  Neck:     Comments: Trachea midline, negative JVD Cardiovascular:     Rate and Rhythm: Normal rate and regular rhythm.     Heart sounds: No murmur heard. No gallop.   Pulmonary:     Effort: Pulmonary effort is normal. No respiratory distress.     Breath sounds: No wheezing, rhonchi or rales.  Musculoskeletal:     Cervical back: Neck supple. No tenderness.  Lymphadenopathy:     Cervical: No cervical adenopathy.  Skin:    Capillary Refill: Capillary refill takes less than 2 seconds.     Coloration: Skin is not jaundiced or pale.     Findings: No rash.  Neurological:     General: No focal deficit present.     Mental Status: She is alert and oriented to person, place, and time.      UC Treatments / Results  Labs (all labs ordered are listed, but only abnormal results are displayed) Labs Reviewed  NOVEL CORONAVIRUS, NAA    EKG   Radiology No results found.  Procedures Procedures (including critical care time)  Medications Ordered in UC Medications - No data to display  Initial Impression / Assessment and Plan / UC Course  I have reviewed the triage vital signs and the nursing notes.  Pertinent labs & imaging results that were available during my care of the patient were reviewed by me and considered in my medical decision making (see chart for details).     Patient afebrile, nontoxic, with  SpO2 99%.  Covid PCR pending.  Patient to quarantine until results are back.  We will treat supportively as outlined below.  Return precautions discussed, patient verbalized understanding and is agreeable to plan. Final Clinical Impressions(s) / UC Diagnoses   Final diagnoses:  Encounter for screening for COVID-19  Cough with exposure to COVID-19 virus     Discharge Instructions  Tessalon for cough. Start flonase, atrovent nasal spray for nasal congestion/drainage. You can use over the counter nasal saline rinse such as neti pot for nasal congestion. Keep hydrated, your urine should be clear to pale yellow in color. Tylenol/motrin for fever and pain. Monitor for any worsening of symptoms, chest pain, shortness of breath, wheezing, swelling of the throat, go to the emergency department for further evaluation needed.     ED Prescriptions    Medication Sig Dispense Auth. Provider   benzonatate (TESSALON) 100 MG capsule Take 1 capsule (100 mg total) by mouth every 8 (eight) hours. 21 capsule Hall-Potvin, [place], PA-C   cetirizine (ZYRTEC ALLERGY) 10 MG tablet Take 1 tablet (10 mg total) by mouth daily. 30 tablet Hall-Potvin, [place], PA-C   fluticasone (FLONASE) 50 MCG/ACT nasal spray Place 1 spray into both nostrils daily. 16 g Hall-Potvin, [place], PA-C     PDMP not reviewed this encounter.   Hall-Potvin, [place], PA-C 11/13/20 1438    Hall-Potvin, [place], [place] 11/13/20 1626

## 2020-11-13 NOTE — ED Triage Notes (Signed)
Cough ,runny nose and cough since Thursday

## 2020-11-13 NOTE — Discharge Instructions (Addendum)

## 2020-11-16 LAB — NOVEL CORONAVIRUS, NAA: SARS-CoV-2, NAA: DETECTED — AB

## 2020-12-03 ENCOUNTER — Telehealth: Payer: Self-pay | Admitting: Neurology

## 2020-12-03 NOTE — Telephone Encounter (Signed)
Pt advised of Dr.JAffe note

## 2020-12-03 NOTE — Telephone Encounter (Signed)
Patient called to ask about her migraine medications: topiramate & nurtec. She just found out she is pregnant so she wants to make sure she can continue to take her migraine meds. She states she hasn't taken them in a few days when she got a positive pregnancy test. If she can't continue to take them, she wanted to know if there are any replacements that are safe to take during pregnancy, and if they could be called in for her? Please call.

## 2020-12-03 NOTE — Telephone Encounter (Signed)
She will need to stop both the topiramate and Nurtec immediately.  Options are limited in pregnancy.  Ideally, we would want to avoid medication if possible.  I recommend taking magnesium oxide 400mg  and riboflavin (which she may need to get at a vitamin shop or off of [employer]) 400mg  daily.  For rescue, Tylenol.  Should limit to no more than 2 days out of the week.  For most women, migraines are reduced during pregnancy, so hopefully there won't be any issues.

## 2021-01-07 ENCOUNTER — Other Ambulatory Visit: Payer: Self-pay

## 2021-01-07 LAB — OB RESULTS CONSOLE HEPATITIS B SURFACE ANTIGEN: Hepatitis B Surface Ag: NEGATIVE

## 2021-01-07 LAB — OB RESULTS CONSOLE ANTIBODY SCREEN: Antibody Screen: NEGATIVE

## 2021-01-07 LAB — OB RESULTS CONSOLE ABO/RH: RH Type: POSITIVE

## 2021-01-07 LAB — OB RESULTS CONSOLE HIV ANTIBODY (ROUTINE TESTING): HIV: NONREACTIVE

## 2021-01-07 LAB — OB RESULTS CONSOLE GC/CHLAMYDIA
Chlamydia: NEGATIVE
Gonorrhea: NEGATIVE

## 2021-01-07 LAB — OB RESULTS CONSOLE RUBELLA ANTIBODY, IGM: Rubella: IMMUNE

## 2021-01-07 LAB — OB RESULTS CONSOLE RPR: RPR: NONREACTIVE

## 2021-01-18 ENCOUNTER — Other Ambulatory Visit (HOSPITAL_COMMUNITY): Payer: Self-pay | Admitting: Obstetrics and Gynecology

## 2021-01-18 MED FILL — BUTALBITAL-APAP-CAFFEINE 50: 50-300-40 | 2 days supply | Qty: 10 | Fill #0

## 2021-01-19 ENCOUNTER — Telehealth: Payer: Self-pay | Admitting: Neurology

## 2021-01-19 NOTE — Telephone Encounter (Signed)
Per pt she is feeling better today after taking  Fioricet given by her Ob-GYN. PT states she was instructed take it as a as needed bases.  Pt states she hasn't taken the Magnesium oxide and  Riboflavin since her migraine started a couple of days ago. Pt wanted to know if she could have script sent into the pharmacy  For the Magnesium and riboflavin in a smaller capsule size. Just to have on hand.   Advised pt that DR.Everlena Cooper is out of the office for the rest of the week.

## 2021-01-19 NOTE — Telephone Encounter (Signed)
Pls let her know the magnesium and riboflavin are over the counter supplements, she can see which size she can tolerate and compare them at any vitamin shop or drug store, thanks

## 2021-01-19 NOTE — Telephone Encounter (Signed)
Pt advised of the note below 

## 2021-01-23 NOTE — Telephone Encounter (Signed)
If Tylenol ineffective for her migraines, then she may take Fioricet but she should take Fioricet sparingly as it may easily cause rebound headaches.  Try to limit to no more than 2 days out of the week.

## 2021-02-10 ENCOUNTER — Other Ambulatory Visit (HOSPITAL_COMMUNITY): Payer: Self-pay

## 2021-02-10 MED ORDER — AMOXICILLIN 400 MG/5ML PO SUSR
ORAL | 0 refills | Status: DC
  Filled 2021-02-10: qty 200, 7d supply, fill #0

## 2021-02-10 MED ORDER — AZITHROMYCIN 250 MG PO TABS
ORAL_TABLET | ORAL | 0 refills | Status: DC
  Filled 2021-02-10: qty 6, 5d supply, fill #0

## 2021-03-03 ENCOUNTER — Other Ambulatory Visit: Payer: Self-pay | Admitting: Obstetrics and Gynecology

## 2021-03-03 DIAGNOSIS — Z363 Encounter for antenatal screening for malformations: Secondary | ICD-10-CM

## 2021-03-03 DIAGNOSIS — Z3A20 20 weeks gestation of pregnancy: Secondary | ICD-10-CM

## 2021-03-03 DIAGNOSIS — O418X20 Other specified disorders of amniotic fluid and membranes, second trimester, not applicable or unspecified: Secondary | ICD-10-CM

## 2021-03-06 ENCOUNTER — Encounter (INDEPENDENT_AMBULATORY_CARE_PROVIDER_SITE_OTHER): Payer: Self-pay

## 2021-03-08 ENCOUNTER — Other Ambulatory Visit: Payer: Self-pay | Admitting: *Deleted

## 2021-03-08 ENCOUNTER — Ambulatory Visit: Payer: No Typology Code available for payment source | Admitting: *Deleted

## 2021-03-08 ENCOUNTER — Ambulatory Visit: Payer: No Typology Code available for payment source | Attending: Obstetrics and Gynecology

## 2021-03-08 ENCOUNTER — Encounter: Payer: Self-pay | Admitting: *Deleted

## 2021-03-08 ENCOUNTER — Other Ambulatory Visit: Payer: Self-pay

## 2021-03-08 VITALS — BP 141/77 | HR 108

## 2021-03-08 DIAGNOSIS — Z3A20 20 weeks gestation of pregnancy: Secondary | ICD-10-CM | POA: Insufficient documentation

## 2021-03-08 DIAGNOSIS — Z363 Encounter for antenatal screening for malformations: Secondary | ICD-10-CM | POA: Diagnosis not present

## 2021-03-08 DIAGNOSIS — O418X20 Other specified disorders of amniotic fluid and membranes, second trimester, not applicable or unspecified: Secondary | ICD-10-CM | POA: Diagnosis present

## 2021-03-08 DIAGNOSIS — O34592 Maternal care for other abnormalities of gravid uterus, second trimester: Secondary | ICD-10-CM

## 2021-03-08 DIAGNOSIS — N856 Intrauterine synechiae: Secondary | ICD-10-CM

## 2021-04-15 ENCOUNTER — Ambulatory Visit: Payer: No Typology Code available for payment source | Admitting: *Deleted

## 2021-04-15 ENCOUNTER — Ambulatory Visit (HOSPITAL_BASED_OUTPATIENT_CLINIC_OR_DEPARTMENT_OTHER): Payer: No Typology Code available for payment source | Admitting: Obstetrics

## 2021-04-15 ENCOUNTER — Other Ambulatory Visit: Payer: Self-pay

## 2021-04-15 ENCOUNTER — Ambulatory Visit: Payer: No Typology Code available for payment source | Attending: Obstetrics

## 2021-04-15 ENCOUNTER — Other Ambulatory Visit: Payer: Self-pay | Admitting: *Deleted

## 2021-04-15 VITALS — BP 128/85 | HR 108

## 2021-04-15 DIAGNOSIS — N856 Intrauterine synechiae: Secondary | ICD-10-CM | POA: Diagnosis not present

## 2021-04-15 DIAGNOSIS — Z362 Encounter for other antenatal screening follow-up: Secondary | ICD-10-CM | POA: Diagnosis not present

## 2021-04-15 DIAGNOSIS — O34592 Maternal care for other abnormalities of gravid uterus, second trimester: Secondary | ICD-10-CM

## 2021-04-15 DIAGNOSIS — O321XX0 Maternal care for breech presentation, not applicable or unspecified: Secondary | ICD-10-CM | POA: Insufficient documentation

## 2021-04-15 DIAGNOSIS — Z3A25 25 weeks gestation of pregnancy: Secondary | ICD-10-CM | POA: Insufficient documentation

## 2021-04-15 NOTE — Progress Notes (Signed)
MFM Note  Lindsay Copeland was seen for a follow up exam due to an intrauterine synechiae that was noted on her prior ultrasound exam.  She denies any problems since her last exam.  She was informed that the fetal growth and amniotic fluid level appears appropriate for her gestational age.  The intrauterine synechiae continues to be noted today.  There appears to be an increased amount of vascularity within the synechiae and a portion of the posterior placenta is attached to the synechiae.    Due to this finding, the patient was advised that there could be a possibility that the synechiae is a uterine septum.  This is the patient's first pregnancy and she has never had an exploration of her uterine cavity prior to this pregnancy.    The intrauterine synechiae/septum ends in the middle of her uterus.  She was reassured that her baby has not been affected by the synechiae/septum.  The fetus remains in the breech presentation today.  The fetal head is located directly below the synechiae/septum.  She was advised that there is a possibility that the synechiae /septum may mechanically prevent the fetus from turning to a vertex presentation.   She was advised that she will most likely require an exploration of the uterine cavity after delivery possibly via hysteroscopy to determine if a uterine septum is present and to determine if resection is necessary.    We will continue to follow her closely with growth ultrasounds throughout her pregnancy.  A follow up exam was scheduled in 4 weeks.   All of the patient's questions were answered to her complete satisfaction.  A total of 20 minutes was spent counseling and coordinating the care for this patient.  Greater than 50% of the time was spent in direct face-to-face contact.

## 2021-04-30 IMAGING — MR MR HEAD WO/W CM
15 of 17 series · 40 of 48 positions shown · IV contrast (Gadavist)
Comparison: MR of the head and cervical spine 06/12/2011

CLINICAL DATA: Acute onset of worst headache of life. Headache
awoke patient from sleep. Paresthesias and numbness in the right
upper extremity. Numbness about the lips. Previous MRI of the head
suggesting demyelinating disease.

EXAM:
MRI HEAD WITHOUT AND WITH CONTRAST
MRI CERVICAL SPINE WITHOUT AND WITH CONTRAST
TECHNIQUE: Multiplanar, multiecho pulse sequences of the brain and surrounding
structures, and cervical spine, to include the craniocervical
junction and cervicothoracic junction, were obtained without and
with intravenous contrast.
CONTRAST:  5mL GADAVIST GADOBUTROL 1 MMOL/ML IV SOLN

[Series 5: DWI · axial · 3.0mm · 0.88mm/px · z∈[-86,+66]mm · 6 of 104 slices shown (1 of 4)]
[im 1/104]
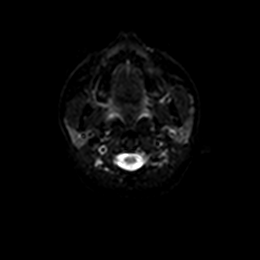
[im 21/104]
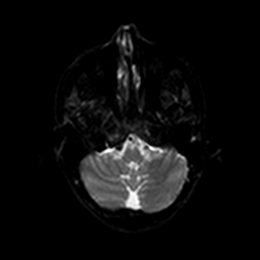
[im 42/104]
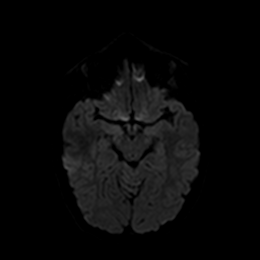
[im 62/104]
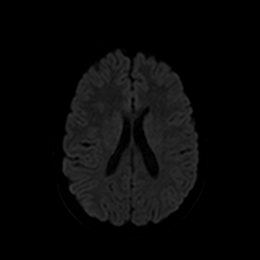
[im 83/104]
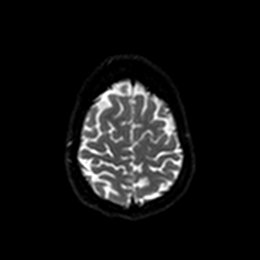
[im 104/104]
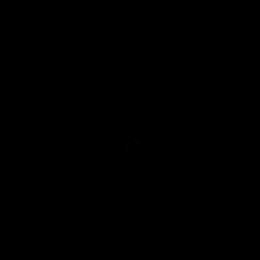

[Series 6: DWI · axial · 3.0mm · 0.88mm/px · z∈[-86,+66]mm · 3 of 52 slices shown (2 of 4)]
[im 1/52]
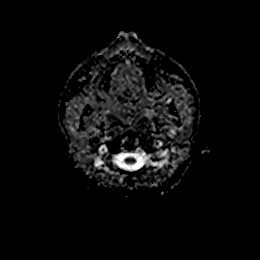
[im 26/52]
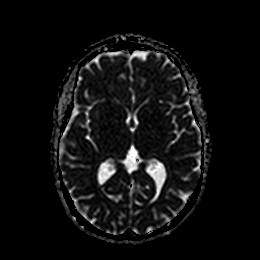
[im 52/52]
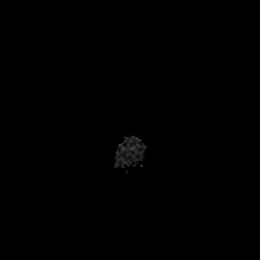

[Series 7: DWI · coronal · 4.0mm · 0.88mm/px · 4 of 74 slices shown (3 of 4)]
[im 1/74]
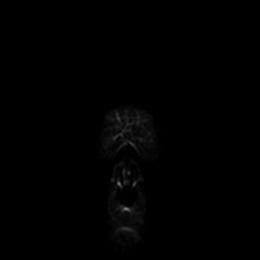
[im 25/74]
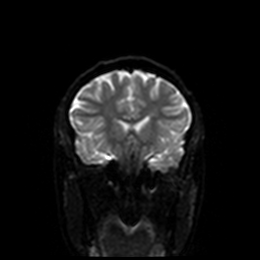
[im 49/74]
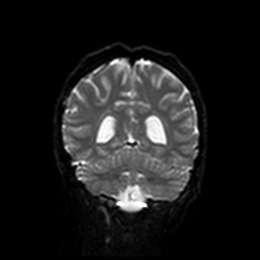
[im 74/74]
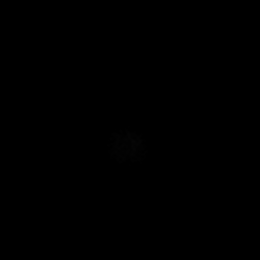

[Series 8: DWI · coronal · 4.0mm · 0.88mm/px · 2 of 37 slices shown (4 of 4)]
[im 1/37]
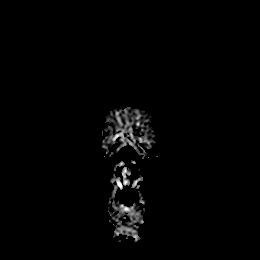
[im 37/37]
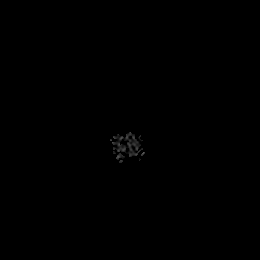

[Series 9: FLAIR · axial · 5.0mm · 0.45mm/px · 1 of 25 slices shown]
[im 1/25]
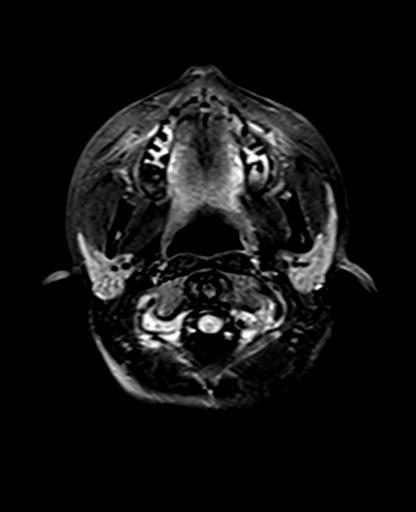

[Series 10: T1 · sagittal · 5.0mm · 0.75mm/px · 1 of 23 slices shown]
[im 1/23]
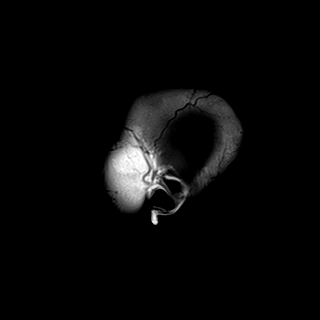

[Series 11: T2 · axial · 5.0mm · 0.72mm/px · 1 of 25 slices shown]
[im 1/25]
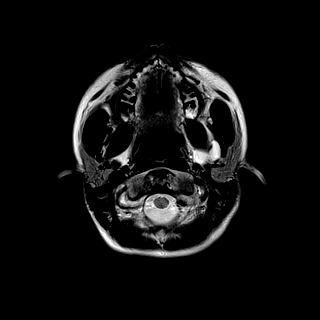

[Series 12: mag_images · axial · 3.0mm · 0.90mm/px · z∈[-86,+66]mm · 3 of 52 slices shown]
[im 1/52]
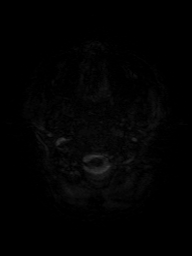
[im 26/52]
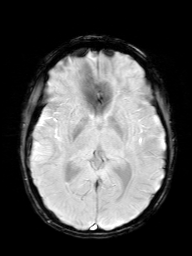
[im 52/52]
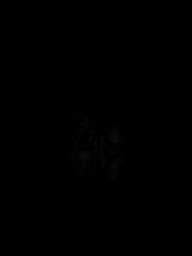

[Series 13: pha_images · axial · 3.0mm · 0.90mm/px · z∈[-86,+66]mm · 3 of 52 slices shown]
[im 1/52]
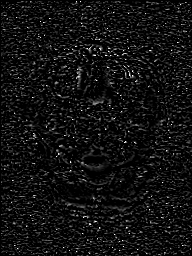
[im 26/52]
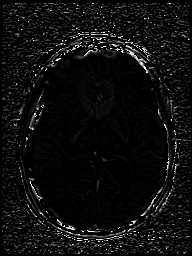
[im 52/52]
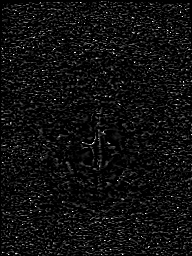

[Series 14: swi_images · axial · 3.0mm · 0.90mm/px · z∈[-86,+66]mm · 3 of 52 slices shown]
[im 1/52]
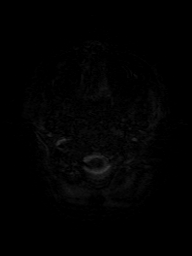
[im 26/52]
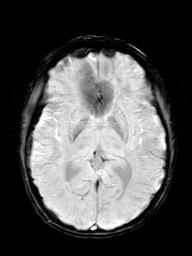
[im 52/52]
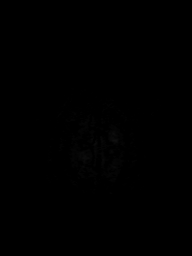

[Series 15: mip_images(sw) · axial · 24.0mm · 0.90mm/px · z∈[-76,+56]mm · 2 of 45 slices shown]
[im 1/45]
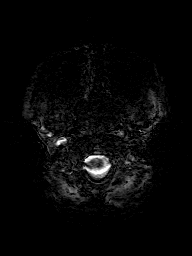
[im 45/45]
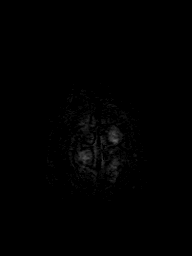

[Series 17: t2_space_dark-fluid_sag_p2_ns-ir · sagittal · 1.0mm · 0.49mm/px · 6 of 144 slices shown]
[im 1/144]
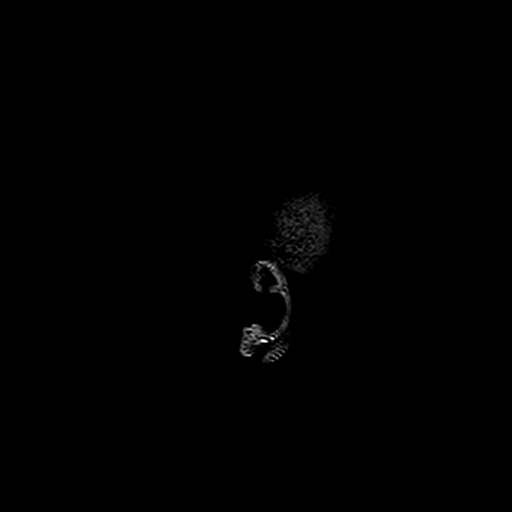
[im 21/144]
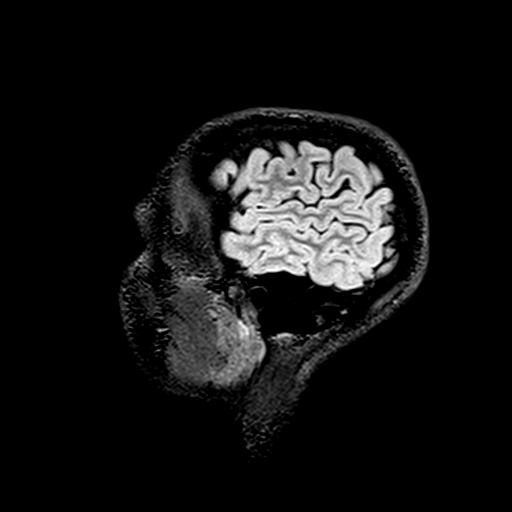
[im 41/144]
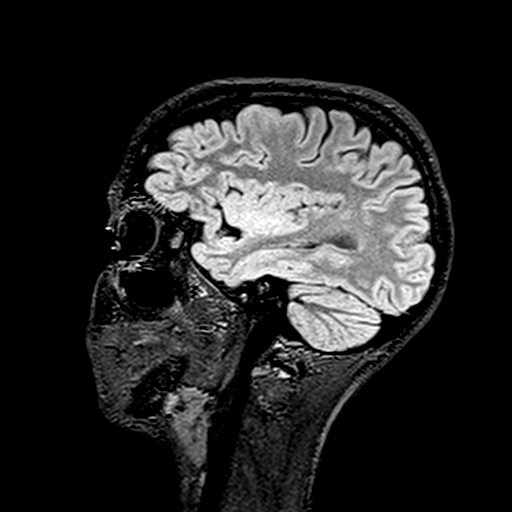
[im 62/144]
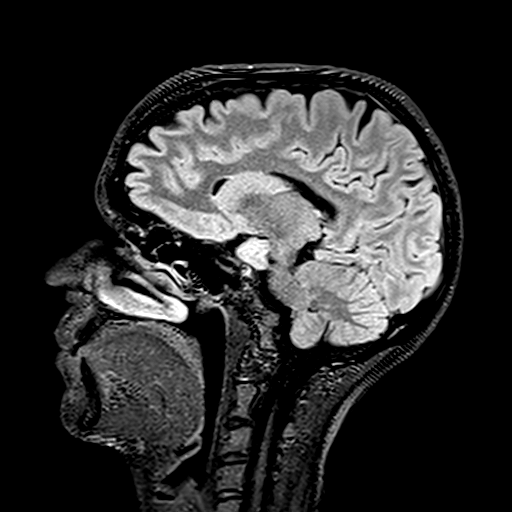
[im 82/144]
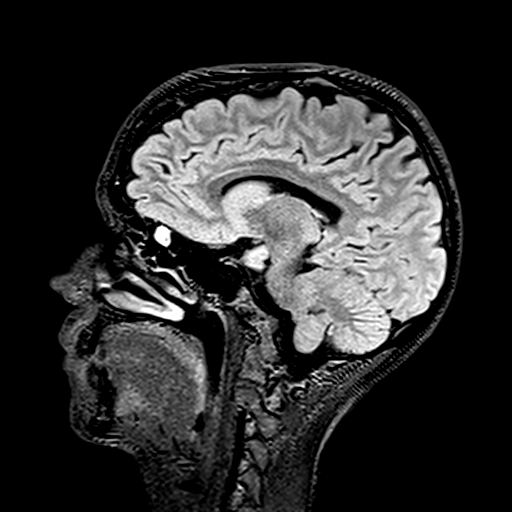
[im 103/144]
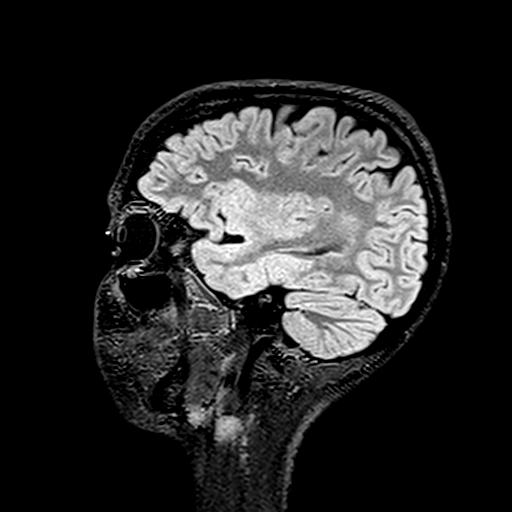

[Series 18: T2 post-contrast · coronal · 5.0mm · 0.72mm/px · 2 of 30 slices shown]
[im 1/30]
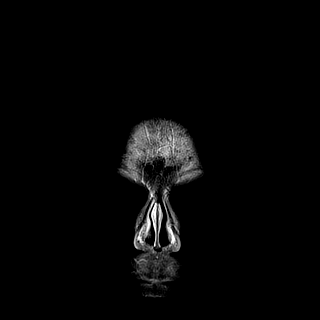
[im 30/30]
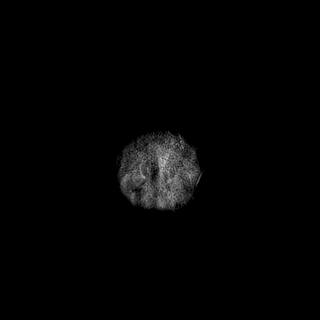

[Series 20: T1 post-contrast · coronal · 5.0mm · 0.34mm/px · 2 of 30 slices shown (1 of 2)]
[im 1/30]
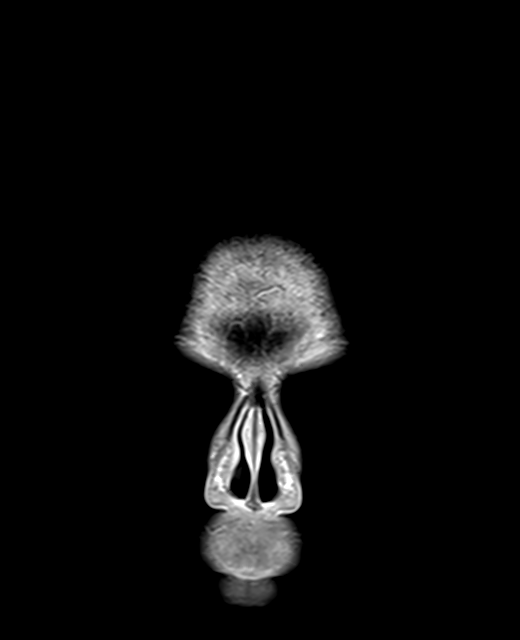
[im 30/30]
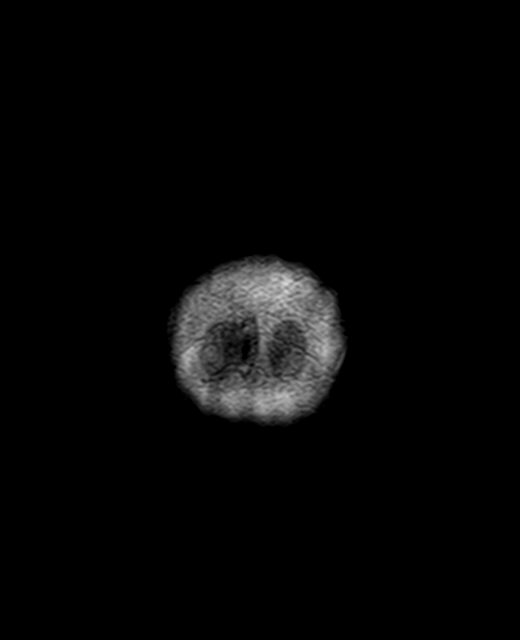

[Series 21: T1 post-contrast · sagittal · 5.0mm · 0.75mm/px · 1 of 23 slices shown (2 of 2)]
[im 1/23]
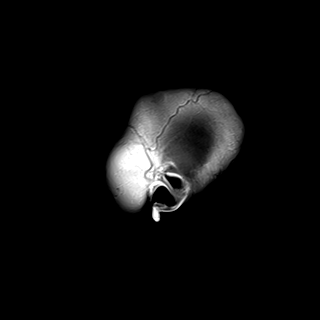

[40 of 48 positions shown; findings below may reference images not displayed]

FINDINGS: MRI HEAD FINDINGS

Brain: Periventricular T2 hyperintensities are associated with the
corpus callosum are similar to the prior exam. No new lesions are
present. There is no associated enhancement or restricted diffusion.
There is no generalized atrophy.

No acute infarct, hemorrhage, or mass lesion is present.

The internal auditory canals are within normal limits. The
ventricles are of normal size. No significant extraaxial fluid
collection is present.

Postcontrast images demonstrate no pathologic enhancement.

Vascular: Flow is present in the major intracranial arteries.

Skull and upper cervical spine: The craniocervical junction is
normal. Upper cervical spine is within normal limits. Marrow signal
is unremarkable.

Sinuses/Orbits: Mild ethmoid sinus disease is present. The paranasal
sinuses and mastoid air cells are otherwise clear. The globes and
orbits are within normal limits.

MRI CERVICAL SPINE FINDINGS

Alignment: Normal

Vertebrae: Marrow signal and vertebral body heights are normal.

Cord: Normal signal is present throughout the cervical and upper
thoracic spinal cord to the lowest imaged level, T2-3. No focal T2
hyperintense lesion or enhancement is present.

Posterior Fossa, vertebral arteries, paraspinal tissues:
Craniocervical junction is normal. Flow is present in the vertebral
arteries bilaterally. Visualized intracranial contents are normal.

Disc levels: Normal disc signal is present. No focal protrusion or
stenosis is present. Foramina are patent bilaterally.
IMPRESSION: 1. No acute or focal abnormality to explain the patient's acute
headaches.
2. Stable appearance of periventricular T2 hyperintensities
compatible with the given diagnosis of multiple sclerosis.
Alternatively, similar findings can be seen related to chronic
migraine headaches.
3. Negative MRI of the cervical spine without and with contrast.

## 2021-04-30 IMAGING — MR MR CERVICAL SPINE WO/W CM
7 of 8 series · 36 of 48 positions shown · IV contrast (gadavist)
Comparison: MR of the head and cervical spine 06/12/2011

CLINICAL DATA: Acute onset of worst headache of life. Headache
awoke patient from sleep. Paresthesias and numbness in the right
upper extremity. Numbness about the lips. Previous MRI of the head
suggesting demyelinating disease.

EXAM:
MRI HEAD WITHOUT AND WITH CONTRAST
MRI CERVICAL SPINE WITHOUT AND WITH CONTRAST
TECHNIQUE: Multiplanar, multiecho pulse sequences of the brain and surrounding
structures, and cervical spine, to include the craniocervical
junction and cervicothoracic junction, were obtained without and
with intravenous contrast.
CONTRAST:  5mL GADAVIST GADOBUTROL 1 MMOL/ML IV SOLN

[Series 9: T2 · sagittal · 3.0mm · 0.69mm/px · 3 of 15 slices shown (1 of 2)]
[im 1/15]
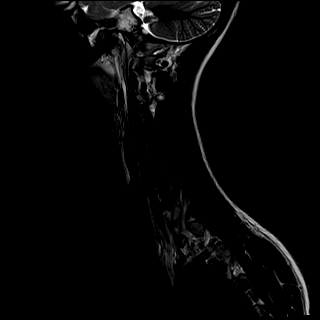
[im 8/15]
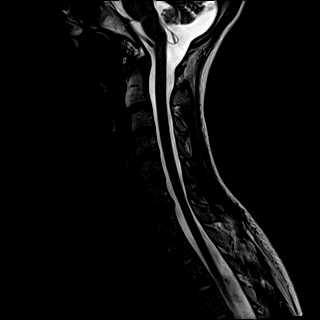
[im 15/15]
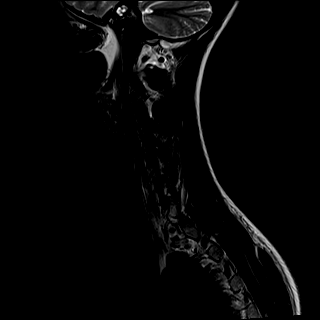

[Series 10: STIR · sagittal · 3.0mm · 0.86mm/px · 3 of 15 slices shown]
[im 1/15]
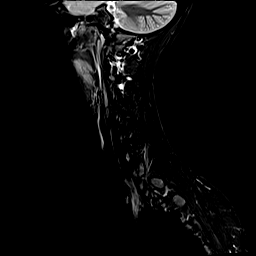
[im 8/15]
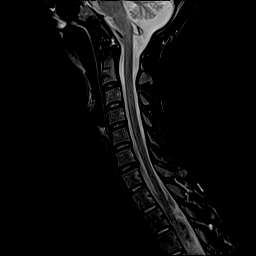
[im 15/15]
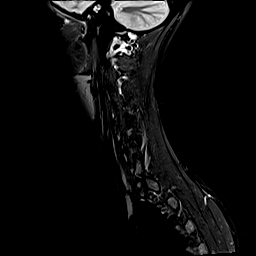

[Series 11: T1 · sagittal · 3.0mm · 0.86mm/px · 3 of 15 slices shown (1 of 2)]
[im 1/15]
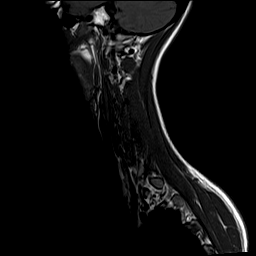
[im 8/15]
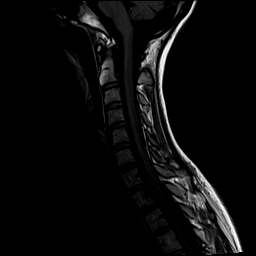
[im 15/15]
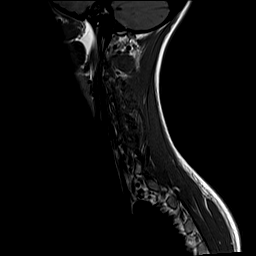

[Series 12: T2 · axial · 3.0mm · 0.78mm/px · z∈[-217,-92]mm · 9 of 40 slices shown (2 of 2)]
[im 1/40]
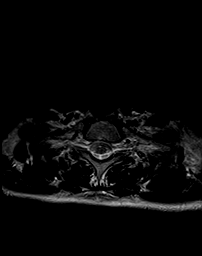
[im 5/40]
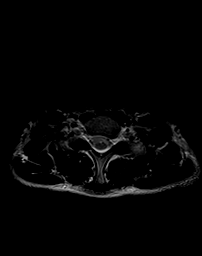
[im 10/40]
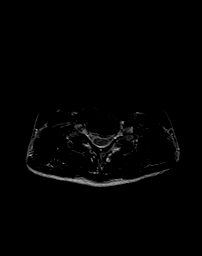
[im 15/40]
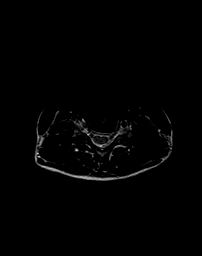
[im 20/40]
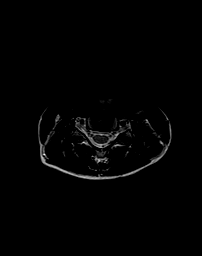
[im 25/40]
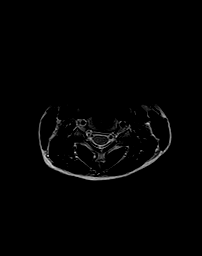
[im 30/40]
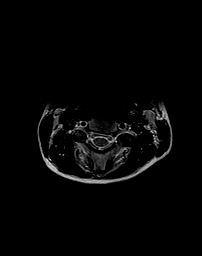
[im 35/40]
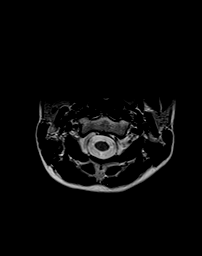
[im 40/40]
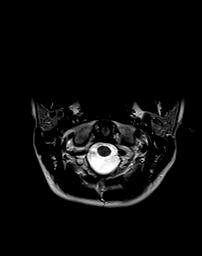

[Series 14: T1 · axial · 3.0mm · 0.39mm/px · z∈[-217,-92]mm · 9 of 40 slices shown (2 of 2)]
[im 1/40]
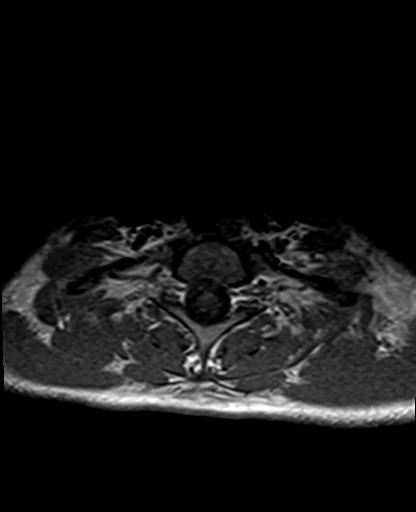
[im 5/40]
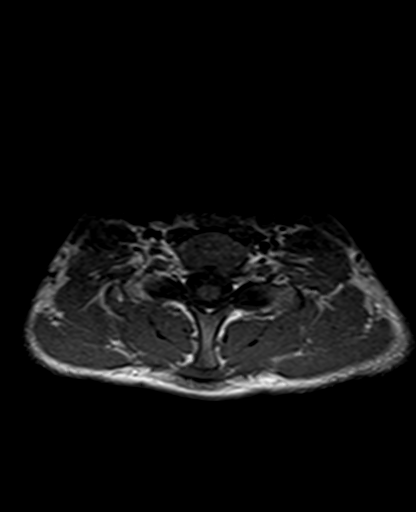
[im 10/40]
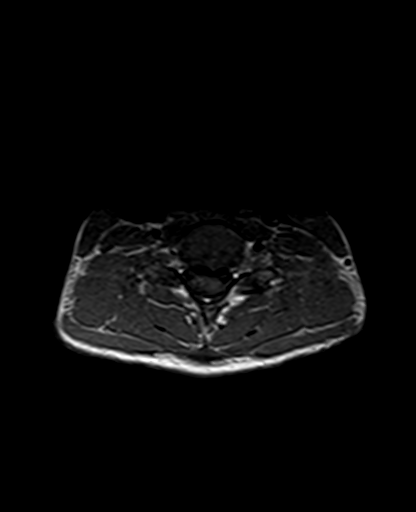
[im 15/40]
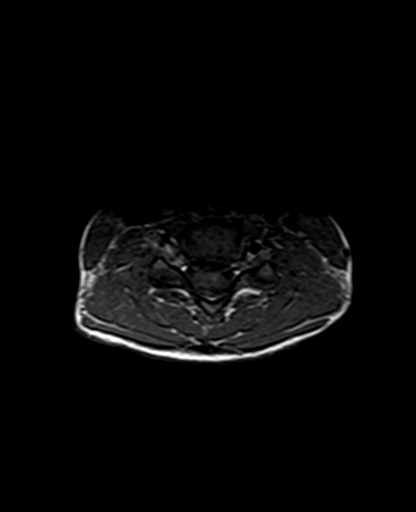
[im 20/40]
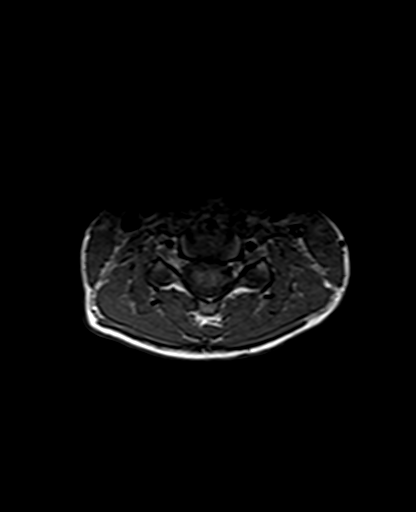
[im 25/40]
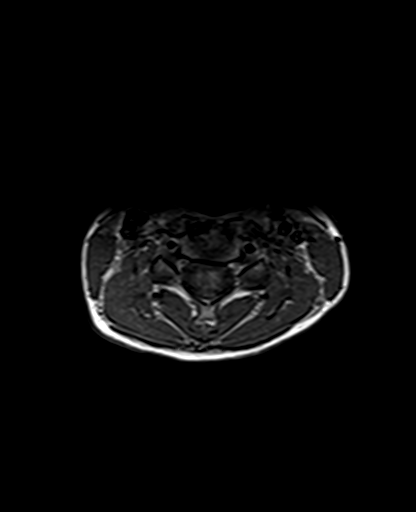
[im 30/40]
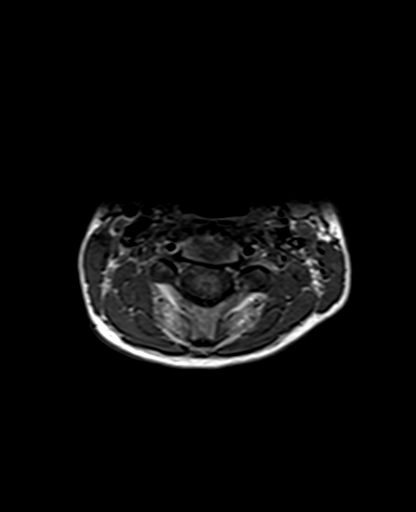
[im 35/40]
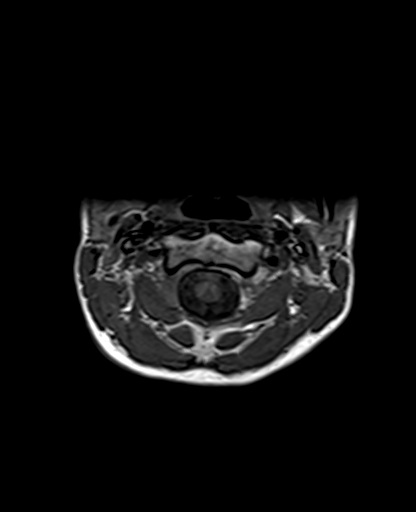
[im 40/40]
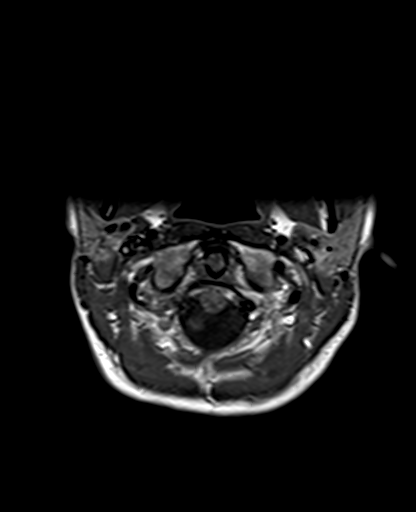

[Series 15: T1 fat-sat post-contrast · sagittal · 3.0mm · 0.86mm/px · 3 of 15 slices shown]
[im 1/15]
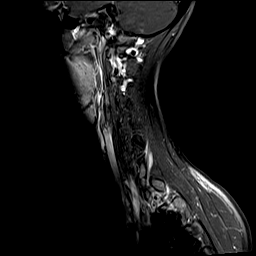
[im 8/15]
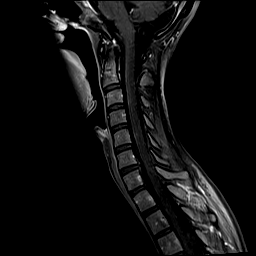
[im 15/15]
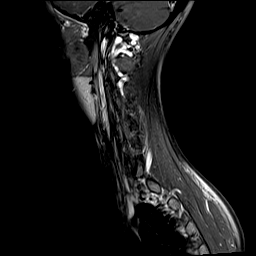

[Series 16: T1 post-contrast · axial · 3.0mm · 0.39mm/px · z∈[-217,-124]mm · 6 of 40 slices shown]
[im 1/40]
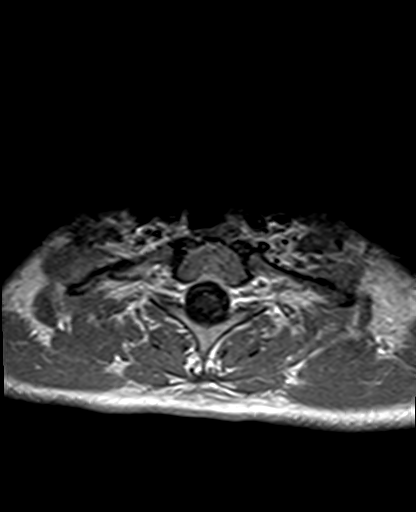
[im 5/40]
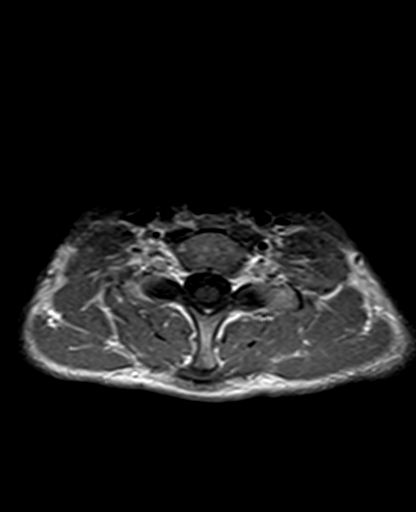
[im 10/40]
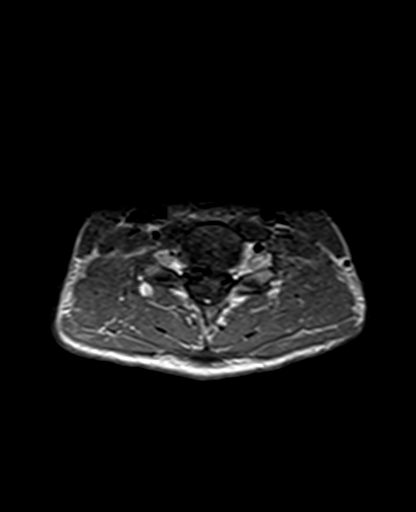
[im 15/40]
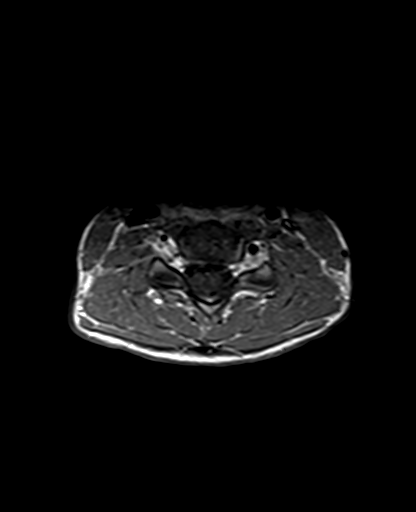
[im 25/40]
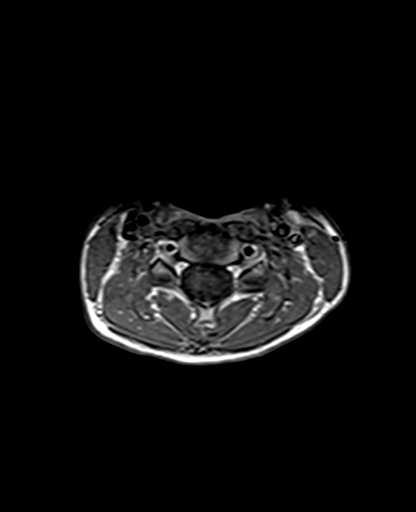
[im 30/40]
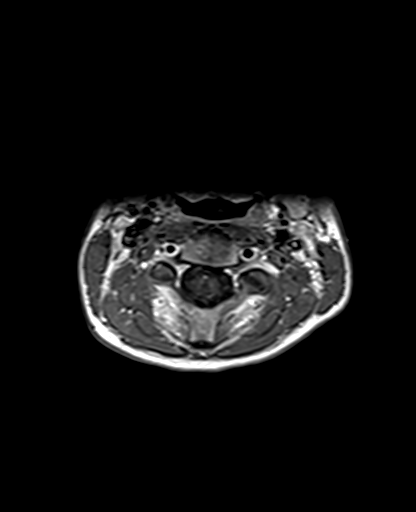

[36 of 48 positions shown; findings below may reference images not displayed]

FINDINGS: MRI HEAD FINDINGS

Brain: Periventricular T2 hyperintensities are associated with the
corpus callosum are similar to the prior exam. No new lesions are
present. There is no associated enhancement or restricted diffusion.
There is no generalized atrophy.

No acute infarct, hemorrhage, or mass lesion is present.

The internal auditory canals are within normal limits. The
ventricles are of normal size. No significant extraaxial fluid
collection is present.

Postcontrast images demonstrate no pathologic enhancement.

Vascular: Flow is present in the major intracranial arteries.

Skull and upper cervical spine: The craniocervical junction is
normal. Upper cervical spine is within normal limits. Marrow signal
is unremarkable.

Sinuses/Orbits: Mild ethmoid sinus disease is present. The paranasal
sinuses and mastoid air cells are otherwise clear. The globes and
orbits are within normal limits.

MRI CERVICAL SPINE FINDINGS

Alignment: Normal

Vertebrae: Marrow signal and vertebral body heights are normal.

Cord: Normal signal is present throughout the cervical and upper
thoracic spinal cord to the lowest imaged level, T2-3. No focal T2
hyperintense lesion or enhancement is present.

Posterior Fossa, vertebral arteries, paraspinal tissues:
Craniocervical junction is normal. Flow is present in the vertebral
arteries bilaterally. Visualized intracranial contents are normal.

Disc levels: Normal disc signal is present. No focal protrusion or
stenosis is present. Foramina are patent bilaterally.
IMPRESSION: 1. No acute or focal abnormality to explain the patient's acute
headaches.
2. Stable appearance of periventricular T2 hyperintensities
compatible with the given diagnosis of multiple sclerosis.
Alternatively, similar findings can be seen related to chronic
migraine headaches.
3. Negative MRI of the cervical spine without and with contrast.

## 2021-05-16 ENCOUNTER — Ambulatory Visit: Payer: No Typology Code available for payment source | Admitting: *Deleted

## 2021-05-16 ENCOUNTER — Ambulatory Visit: Payer: No Typology Code available for payment source | Attending: Obstetrics

## 2021-05-16 ENCOUNTER — Other Ambulatory Visit: Payer: Self-pay

## 2021-05-16 ENCOUNTER — Ambulatory Visit: Payer: No Typology Code available for payment source | Attending: Maternal & Fetal Medicine

## 2021-05-16 ENCOUNTER — Encounter: Payer: Self-pay | Admitting: *Deleted

## 2021-05-16 VITALS — BP 119/74 | HR 98

## 2021-05-16 DIAGNOSIS — O34593 Maternal care for other abnormalities of gravid uterus, third trimester: Secondary | ICD-10-CM | POA: Diagnosis not present

## 2021-05-16 DIAGNOSIS — O321XX0 Maternal care for breech presentation, not applicable or unspecified: Secondary | ICD-10-CM

## 2021-05-16 DIAGNOSIS — Z3A30 30 weeks gestation of pregnancy: Secondary | ICD-10-CM

## 2021-05-16 DIAGNOSIS — N856 Intrauterine synechiae: Secondary | ICD-10-CM

## 2021-05-16 DIAGNOSIS — Z362 Encounter for other antenatal screening follow-up: Secondary | ICD-10-CM

## 2021-05-16 DIAGNOSIS — O418X30 Other specified disorders of amniotic fluid and membranes, third trimester, not applicable or unspecified: Secondary | ICD-10-CM

## 2021-05-17 ENCOUNTER — Other Ambulatory Visit: Payer: Self-pay | Admitting: *Deleted

## 2021-05-17 DIAGNOSIS — O34593 Maternal care for other abnormalities of gravid uterus, third trimester: Secondary | ICD-10-CM

## 2021-06-16 ENCOUNTER — Inpatient Hospital Stay (HOSPITAL_COMMUNITY)
Admission: AD | Admit: 2021-06-16 | Discharge: 2021-06-16 | Disposition: A | Discharge status: Home / Self Care | Payer: No Typology Code available for payment source | Attending: Obstetrics and Gynecology | Admitting: Obstetrics and Gynecology

## 2021-06-16 ENCOUNTER — Encounter (HOSPITAL_COMMUNITY): Payer: Self-pay | Admitting: Obstetrics and Gynecology

## 2021-06-16 ENCOUNTER — Other Ambulatory Visit (HOSPITAL_COMMUNITY): Payer: Self-pay

## 2021-06-16 ENCOUNTER — Other Ambulatory Visit: Payer: Self-pay

## 2021-06-16 DIAGNOSIS — R030 Elevated blood-pressure reading, without diagnosis of hypertension: Secondary | ICD-10-CM | POA: Diagnosis not present

## 2021-06-16 DIAGNOSIS — O99891 Other specified diseases and conditions complicating pregnancy: Secondary | ICD-10-CM

## 2021-06-16 DIAGNOSIS — Z3689 Encounter for other specified antenatal screening: Secondary | ICD-10-CM

## 2021-06-16 DIAGNOSIS — Z3A34 34 weeks gestation of pregnancy: Secondary | ICD-10-CM

## 2021-06-16 DIAGNOSIS — O26893 Other specified pregnancy related conditions, third trimester: Secondary | ICD-10-CM | POA: Diagnosis not present

## 2021-06-16 LAB — PROTEIN / CREATININE RATIO, URINE
Creatinine, Urine: 90.38 mg/dL
Protein Creatinine Ratio: 0.08 mg/mg{Cre} (ref 0.00–0.15)
Total Protein, Urine: 7 mg/dL

## 2021-06-16 LAB — CBC WITH DIFFERENTIAL/PLATELET
Abs Immature Granulocytes: 0.04 10*3/uL (ref 0.00–0.07)
Basophils Absolute: 0 10*3/uL (ref 0.0–0.1)
Basophils Relative: 0 %
Eosinophils Absolute: 0 10*3/uL (ref 0.0–0.5)
Eosinophils Relative: 0 %
HCT: 33.1 % — ABNORMAL LOW (ref 36.0–46.0)
Hemoglobin: 10.9 g/dL — ABNORMAL LOW (ref 12.0–15.0)
Immature Granulocytes: 0 %
Lymphocytes Relative: 22 %
Lymphs Abs: 2 10*3/uL (ref 0.7–4.0)
MCH: 29.1 pg (ref 26.0–34.0)
MCHC: 32.9 g/dL (ref 30.0–36.0)
MCV: 88.3 fL (ref 80.0–100.0)
Monocytes Absolute: 0.6 10*3/uL (ref 0.1–1.0)
Monocytes Relative: 7 %
Neutro Abs: 6.2 10*3/uL (ref 1.7–7.7)
Neutrophils Relative %: 71 %
Platelets: 239 10*3/uL (ref 150–400)
RBC: 3.75 MIL/uL — ABNORMAL LOW (ref 3.87–5.11)
RDW: 12.7 % (ref 11.5–15.5)
WBC: 8.9 10*3/uL (ref 4.0–10.5)
nRBC: 0 % (ref 0.0–0.2)

## 2021-06-16 LAB — COMPREHENSIVE METABOLIC PANEL
ALT: 16 U/L (ref 0–44)
AST: 28 U/L (ref 15–41)
Albumin: 2.5 g/dL — ABNORMAL LOW (ref 3.5–5.0)
Alkaline Phosphatase: 99 U/L (ref 38–126)
Anion gap: 9 (ref 5–15)
BUN: 8 mg/dL (ref 6–20)
CO2: 22 mmol/L (ref 22–32)
Calcium: 8.6 mg/dL — ABNORMAL LOW (ref 8.9–10.3)
Chloride: 106 mmol/L (ref 98–111)
Creatinine, Ser: 0.82 mg/dL (ref 0.44–1.00)
GFR, Estimated: >60 mL/min (ref >60)
Glucose, Bld: 98 mg/dL (ref 70–99)
Potassium: 4.1 mmol/L (ref 3.5–5.1)
Sodium: 137 mmol/L (ref 135–145)
Total Bilirubin: 0.3 mg/dL (ref 0.3–1.2)
Total Protein: 5.9 g/dL — ABNORMAL LOW (ref 6.5–8.1)

## 2021-06-16 MED ORDER — ONDANSETRON 8 MG PO TBDP
ORAL_TABLET | ORAL | 2 refills | Status: DC
  Filled 2021-06-16: qty 90, 30d supply, fill #0

## 2021-06-16 NOTE — MAU Note (Signed)
Presents c/o low fetal heart rate.  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  Denies VB or LOF.  Endorses +FM.

## 2021-06-16 NOTE — MAU Provider Note (Signed)
History     CSN: [id number]  Arrival date and time: 06/16/21 1738   Event Date/Time   First Provider Initiated Contact with Patient 06/16/21 1752      Chief Complaint  Patient presents with   Low FHR   Ms. Lindsay Copeland is a 25 y.o. G1P0 at [redacted]w[redacted]d who presents to MAU for preeclampsia evaluation after she came to MAU for a check on her baby's heart rate and was incidentally found to have elevated pressures. Patient states she has just had a general and vague feeling of being unwell, so her mother, who is an Charity fundraiser, came over with her stethoscope to listen to the baby's heart rate and heard it was in the 80s. Patient states she does not have a Doppler at home. Patient states she then called her OB who told her to come to MAU for evaluation. Patient was found to have elevated pressures incidentally, but is asymptomatic.  Pt denies HA, blurry vision/seeing spots, N/V, epigastric pain, swelling in face and hands, sudden weight gain. Pt denies chest pain and SOB.  Pt denies constipation, diarrhea, or urinary problems. Pt denies fever, chills, fatigue, sweating or changes in appetite. Pt denies dizziness, light-headedness, weakness.  Pt denies VB, ctx, LOF and reports good FM.   OB History     Gravida  1   Para      Term      Preterm      AB      Living         SAB      IAB      Ectopic      Multiple      Live Births              Past Medical History:  Diagnosis Date   Migraines     Past Surgical History:  Procedure Laterality Date   WISDOM TOOTH EXTRACTION      Family History  Problem Relation Age of Onset   Healthy Mother    Bone cancer Father    Healthy Sister    Healthy Brother     Social History   Tobacco Use   Smoking status: Never   Smokeless tobacco: Never  Vaping Use   Vaping Use: Never used  Substance Use Topics   Alcohol use: Never   Drug use: Never    Allergies: No Known Allergies  Medications Prior to Admission   Medication Sig Dispense Refill Last Dose   Butalbital-APAP-Caffeine 50-300-40 MG CAPS TAKE 1 CAPSULE BY MOUTH EVERY 4 HOURS AS NEEDED 10 capsule 0    ondansetron (ZOFRAN ODT) 4 MG disintegrating tablet Take 1 tablet (4 mg total) by mouth every 8 (eight) hours as needed for nausea or vomiting. 20 tablet 3    ondansetron (ZOFRAN-ODT) 8 MG disintegrating tablet Place 1 tablet into the mouth and allow to dissolve 3 times a day as needed. 90 tablet 2    Prenatal MV & Min w/FA-DHA (PRENATAL GUMMIES PO) Take by mouth.      Rimegepant Sulfate (NURTEC) 75 MG TBDP Take 1 tablet by mouth daily as needed. (Patient not taking: Reported on 03/08/2021) 8 tablet 11    topiramate (TOPAMAX) 25 MG tablet TAKE 1 TABLET BY MOUTH TWO TIMES DAILY (Patient not taking: Reported on 03/08/2021) 60 tablet 3     Review of Systems  Constitutional:  Negative for chills, diaphoresis, fatigue and fever.  Eyes:  Negative for visual disturbance.  Respiratory:  Negative for shortness of  breath.   Cardiovascular:  Negative for chest pain.  Gastrointestinal:  Negative for abdominal pain, constipation, diarrhea, nausea and vomiting.  Genitourinary:  Negative for dysuria, flank pain, frequency, pelvic pain, urgency, vaginal bleeding and vaginal discharge.  Neurological:  Negative for dizziness, weakness, light-headedness and headaches.   Physical Exam   Blood pressure (!) 149/91, pulse 80, temperature 98.1 F (36.7 C), temperature source Oral, resp. rate 20, last menstrual period 10/07/2020, SpO2 99 %.  Patient Vitals for the past 24 hrs:  BP Temp Temp src Pulse Resp SpO2  06/16/21 2001 (!) 149/91 -- -- 80 -- 99 %  06/16/21 1946 (!) 146/93 -- -- 82 -- 99 %  06/16/21 1931 132/89 -- -- 88 -- 100 %  06/16/21 1916 132/89 -- -- 88 -- 99 %  06/16/21 1901 137/90 -- -- 81 -- 98 %  06/16/21 1846 (!) 135/92 -- -- 86 -- 98 %  06/16/21 1837 (!) 149/96 -- -- 88 -- --  06/16/21 1836 (!) 149/96 -- -- 88 -- 98 %  06/16/21 1816 (!) 142/92  -- -- 87 -- 97 %  06/16/21 1807 (!) 145/98 -- -- 95 -- --  06/16/21 1751 137/89 98.1 F (36.7 C) Oral 91 20 99 %   Physical Exam Vitals and nursing note reviewed.  Constitutional:      General: She is not in acute distress.    Appearance: Normal appearance. She is not ill-appearing, toxic-appearing or diaphoretic.  HENT:     Head: Normocephalic and atraumatic.  Pulmonary:     Effort: Pulmonary effort is normal.  Neurological:     Mental Status: She is alert and oriented to person, place, and time.  Psychiatric:        Mood and Affect: Mood normal.        Behavior: Behavior normal.        Thought Content: Thought content normal.        Judgment: Judgment normal.   Results for orders placed or performed during the hospital encounter of 06/16/21 (from the past 24 hour(s))  CBC with Differential/Platelet     Status: Abnormal   Collection Time: 06/16/21  6:25 PM  Result Value Ref Range   WBC 8.9 4.0 - 10.5 K/uL   RBC 3.75 (L) 3.87 - 5.11 MIL/uL   Hemoglobin 10.9 (L) 12.0 - 15.0 g/dL   HCT 67.1 (L) 24.5 - 80.9 %   MCV 88.3 80.0 - 100.0 fL   MCH 29.1 26.0 - 34.0 pg   MCHC 32.9 30.0 - 36.0 g/dL   RDW 98.3 38.2 - 50.5 %   Platelets 239 150 - 400 K/uL   nRBC 0.0 0.0 - 0.2 %   Neutrophils Relative % 71 %   Neutro Abs 6.2 1.7 - 7.7 K/uL   Lymphocytes Relative 22 %   Lymphs Abs 2.0 0.7 - 4.0 K/uL   Monocytes Relative 7 %   Monocytes Absolute 0.6 0.1 - 1.0 K/uL   Eosinophils Relative 0 %   Eosinophils Absolute 0.0 0.0 - 0.5 K/uL   Basophils Relative 0 %   Basophils Absolute 0.0 0.0 - 0.1 K/uL   Immature Granulocytes 0 %   Abs Immature Granulocytes 0.04 0.00 - 0.07 K/uL  Comprehensive metabolic panel     Status: Abnormal   Collection Time: 06/16/21  6:25 PM  Result Value Ref Range   Sodium 137 135 - 145 mmol/L   Potassium 4.1 3.5 - 5.1 mmol/L   Chloride 106 98 - 111 mmol/L  CO2 22 22 - 32 mmol/L   Glucose, Bld 98 70 - 99 mg/dL   BUN 8 6 - 20 mg/dL   Creatinine, Ser 1.610.82  0.44 - 1.00 mg/dL   Calcium 8.6 (L) 8.9 - 10.3 mg/dL   Total Protein 5.9 (L) 6.5 - 8.1 g/dL   Albumin 2.5 (L) 3.5 - 5.0 g/dL   AST 28 15 - 41 U/L   ALT 16 0 - 44 U/L   Alkaline Phosphatase 99 38 - 126 U/L   Total Bilirubin 0.3 0.3 - 1.2 mg/dL   GFR, Estimated >09>60 >60>60 mL/min   Anion gap 9 5 - 15  Protein / creatinine ratio, urine     Status: None   Collection Time: 06/16/21  6:52 PM  Result Value Ref Range   Creatinine, Urine 90.38 mg/dL   Total Protein, Urine 7 mg/dL   Protein Creatinine Ratio 0.08 0.00 - 0.15 mg/mg[Cre]   No results found.  MAU Course  Procedures  MDM -preeclampsia evaluation without severe range BP in MAU on admission -elevated BP of 140s/90s -symptoms include: none -CBC: H/H 10.9/33.1, platelets 239 -CMP: serum creatinine 0.82, AST/ALT 28/16 -PCr: 0.08 -EFM: reactive       -baseline: 130       -variability: moderate       -accels: present, 15x15       -decels: absent       -TOCO: few, irregular, pt not feeling -called and spoke with Dr. Tenny Crawoss to alert to possible new onset elevated pressures. No OB records available for review past 04/29/2021. Per Dr. Tenny Crawoss, pt to call office tomorrow AM to schedule appt for BP check either tomorrow or Monday. [facility]. She is being followed by MFM for uterine synechiae vs amniotic band, and EFW also noted to be 92% on last scan. -pt discharged to home in stable condition  Orders Placed This Encounter  Procedures   CBC with Differential/Platelet    Standing Status:   Standing    Number of Occurrences:   1   Comprehensive metabolic panel    Standing Status:   Standing    Number of Occurrences:   1   Protein / creatinine ratio, urine    Standing Status:   Standing    Number of Occurrences:   1   Discharge patient    Order Specific Question:   Discharge disposition    Answer:   01-Home or Self Care [1]    Order Specific Question:   Discharge patient date     Answer:   06/16/2021   No orders of the defined types were placed in this encounter.  Assessment and Plan   1. Elevated blood pressure reading without diagnosis of hypertension   2. [redacted] weeks gestation of pregnancy   3. NST (non-stress test) reactive    Allergies as of 06/16/2021   No Known Allergies      Medication List     TAKE these medications    Butalbital-APAP-Caffeine 50-300-40 MG Caps TAKE 1 CAPSULE BY MOUTH EVERY 4 HOURS AS NEEDED   Nurtec 75 MG Tbdp Generic drug: Rimegepant Sulfate Take 1 tablet by mouth daily as needed.   ondansetron 4 MG disintegrating tablet Commonly known as: Zofran ODT Take 1 tablet (4 mg total) by mouth every 8 (eight) hours as needed for nausea or vomiting.   ondansetron 8 MG disintegrating tablet Commonly known as: ZOFRAN-ODT Place 1 tablet into the mouth and allow  to dissolve 3 times a day as needed.   PRENATAL GUMMIES PO Take by mouth.   topiramate 25 MG tablet Commonly known as: TOPAMAX TAKE 1 TABLET BY MOUTH TWO TIMES DAILY       -Reviewed warning blood pressure values (systolic = / > 140 and/or diastolic =/> 90). Explained that, if blood pressure is elevated, she should sit down, rest, and eat/drink something. If still elevated 15 minutes later, and she is greater than 20 weeks, she should call clinic or come to MAU. She should come to MAU if she has elevated pressures and any of the following:  -headache not relieved with tylenol, rest, hydration -blurry vision, floating spots in her vision -sudden full-body edema or facial edema -RUQ pain that is constant. -chest pain or shortness of breath -new onset or sudden worsening of nausea and vomiting These symptoms may indicate that her blood pressure is worsening and she may be developing gestational hypertension or pre-eclampsia, which is an emergency.  -return MAU precautions given -pt discharged to home in stable condition  Odie Sera Lindsay Copeland 06/16/2021, 8:45 PM

## 2021-06-17 ENCOUNTER — Ambulatory Visit: Payer: No Typology Code available for payment source | Attending: Maternal & Fetal Medicine

## 2021-06-17 ENCOUNTER — Ambulatory Visit: Payer: No Typology Code available for payment source | Admitting: *Deleted

## 2021-06-17 VITALS — BP 139/88 | HR 85

## 2021-06-17 DIAGNOSIS — Z3A34 34 weeks gestation of pregnancy: Secondary | ICD-10-CM

## 2021-06-17 DIAGNOSIS — O34593 Maternal care for other abnormalities of gravid uterus, third trimester: Secondary | ICD-10-CM | POA: Diagnosis not present

## 2021-06-17 DIAGNOSIS — O321XX0 Maternal care for breech presentation, not applicable or unspecified: Secondary | ICD-10-CM

## 2021-06-17 DIAGNOSIS — Z362 Encounter for other antenatal screening follow-up: Secondary | ICD-10-CM | POA: Insufficient documentation

## 2021-06-20 ENCOUNTER — Other Ambulatory Visit: Payer: Self-pay | Admitting: *Deleted

## 2021-06-20 DIAGNOSIS — O139 Gestational [pregnancy-induced] hypertension without significant proteinuria, unspecified trimester: Secondary | ICD-10-CM

## 2021-06-24 ENCOUNTER — Other Ambulatory Visit: Payer: Self-pay

## 2021-06-24 ENCOUNTER — Other Ambulatory Visit (HOSPITAL_COMMUNITY): Payer: Self-pay

## 2021-06-24 ENCOUNTER — Ambulatory Visit: Payer: No Typology Code available for payment source | Admitting: *Deleted

## 2021-06-24 ENCOUNTER — Ambulatory Visit: Payer: No Typology Code available for payment source | Attending: Maternal & Fetal Medicine

## 2021-06-24 VITALS — BP 140/85 | HR 93

## 2021-06-24 DIAGNOSIS — O321XX0 Maternal care for breech presentation, not applicable or unspecified: Secondary | ICD-10-CM | POA: Diagnosis not present

## 2021-06-24 DIAGNOSIS — O133 Gestational [pregnancy-induced] hypertension without significant proteinuria, third trimester: Secondary | ICD-10-CM | POA: Diagnosis present

## 2021-06-24 DIAGNOSIS — Z3A35 35 weeks gestation of pregnancy: Secondary | ICD-10-CM

## 2021-06-24 DIAGNOSIS — O34593 Maternal care for other abnormalities of gravid uterus, third trimester: Secondary | ICD-10-CM

## 2021-06-24 DIAGNOSIS — O139 Gestational [pregnancy-induced] hypertension without significant proteinuria, unspecified trimester: Secondary | ICD-10-CM | POA: Diagnosis present

## 2021-06-24 LAB — OB RESULTS CONSOLE GBS: GBS: NEGATIVE

## 2021-06-28 NOTE — Patient Instructions (Signed)
Lindsay Copeland  06/28/2021   Your procedure is scheduled on:  07/06/2021  Arrive at 0530 at [employer] [facility] on [address] at [facility]  and [employer]. You are invited to use the FREE valet parking or use the Visitor's parking deck.  Pick up the phone at the desk and dial [phone].  Call this number if you have problems the morning of surgery: [phone]  Remember:   Do not eat food:(After Midnight) Desps de medianoche.  Do not drink clear liquids: (After Midnight) Desps de medianoche.  Take these medicines the morning of surgery with A SIP OF WATER:  none   Do not wear jewelry, make-up or nail polish.  Do not wear lotions, powders, or perfumes. Do not wear deodorant.  Do not shave 48 hours prior to surgery.  Do not bring valuables to the hospital.  [facility] is not   responsible for any belongings or valuables brought to the hospital.  Contacts, dentures or bridgework may not be worn into surgery.  Leave suitcase in the car. After surgery it may be brought to your room.  For patients admitted to the hospital, checkout time is 11:00 AM the day of              discharge.      Please read over the following fact sheets that you were given:     Preparing for Surgery

## 2021-06-29 ENCOUNTER — Encounter (HOSPITAL_COMMUNITY): Payer: Self-pay

## 2021-07-01 ENCOUNTER — Ambulatory Visit: Payer: No Typology Code available for payment source | Attending: Maternal & Fetal Medicine

## 2021-07-01 ENCOUNTER — Encounter: Payer: Self-pay | Admitting: *Deleted

## 2021-07-01 ENCOUNTER — Other Ambulatory Visit: Payer: Self-pay

## 2021-07-01 ENCOUNTER — Ambulatory Visit: Payer: No Typology Code available for payment source | Admitting: *Deleted

## 2021-07-01 VITALS — BP 137/91 | HR 92

## 2021-07-01 DIAGNOSIS — Z3A36 36 weeks gestation of pregnancy: Secondary | ICD-10-CM

## 2021-07-01 DIAGNOSIS — O139 Gestational [pregnancy-induced] hypertension without significant proteinuria, unspecified trimester: Secondary | ICD-10-CM | POA: Diagnosis not present

## 2021-07-01 DIAGNOSIS — O133 Gestational [pregnancy-induced] hypertension without significant proteinuria, third trimester: Secondary | ICD-10-CM | POA: Diagnosis present

## 2021-07-01 DIAGNOSIS — O34593 Maternal care for other abnormalities of gravid uterus, third trimester: Secondary | ICD-10-CM

## 2021-07-01 NOTE — Progress Notes (Signed)
Dr. Grace Bushy aware of elevated BP'S

## 2021-07-03 ENCOUNTER — Inpatient Hospital Stay (HOSPITAL_COMMUNITY): Payer: No Typology Code available for payment source | Admitting: Certified Registered Nurse Anesthetist

## 2021-07-03 ENCOUNTER — Inpatient Hospital Stay (HOSPITAL_COMMUNITY)
Admission: AD | Admit: 2021-07-03 | Discharge: 2021-07-06 | DRG: 787 | Disposition: A | Discharge status: Home / Self Care | Payer: No Typology Code available for payment source | Attending: Obstetrics and Gynecology | Admitting: Obstetrics and Gynecology

## 2021-07-03 ENCOUNTER — Encounter (HOSPITAL_COMMUNITY): Payer: Self-pay | Admitting: Obstetrics and Gynecology

## 2021-07-03 ENCOUNTER — Other Ambulatory Visit: Payer: Self-pay

## 2021-07-03 ENCOUNTER — Encounter (HOSPITAL_COMMUNITY)
Admission: AD | Disposition: A | Discharge status: Home / Self Care | Payer: Self-pay | Source: Home / Self Care | Attending: Obstetrics and Gynecology

## 2021-07-03 ENCOUNTER — Inpatient Hospital Stay (HOSPITAL_COMMUNITY)
Admission: RE | Admit: 2021-07-03 | Payer: No Typology Code available for payment source | Source: Home / Self Care | Admitting: Obstetrics and Gynecology

## 2021-07-03 DIAGNOSIS — O9081 Anemia of the puerperium: Secondary | ICD-10-CM | POA: Diagnosis not present

## 2021-07-03 DIAGNOSIS — D62 Acute posthemorrhagic anemia: Secondary | ICD-10-CM | POA: Diagnosis not present

## 2021-07-03 DIAGNOSIS — O3403 Maternal care for unspecified congenital malformation of uterus, third trimester: Secondary | ICD-10-CM | POA: Diagnosis present

## 2021-07-03 DIAGNOSIS — Z20822 Contact with and (suspected) exposure to covid-19: Secondary | ICD-10-CM | POA: Diagnosis present

## 2021-07-03 DIAGNOSIS — Z98891 History of uterine scar from previous surgery: Secondary | ICD-10-CM

## 2021-07-03 DIAGNOSIS — Z3A37 37 weeks gestation of pregnancy: Secondary | ICD-10-CM

## 2021-07-03 DIAGNOSIS — O322XX0 Maternal care for transverse and oblique lie, not applicable or unspecified: Secondary | ICD-10-CM | POA: Diagnosis present

## 2021-07-03 DIAGNOSIS — Q5128 Other doubling of uterus, other specified: Secondary | ICD-10-CM | POA: Diagnosis not present

## 2021-07-03 DIAGNOSIS — O4292 Full-term premature rupture of membranes, unspecified as to length of time between rupture and onset of labor: Secondary | ICD-10-CM | POA: Diagnosis present

## 2021-07-03 DIAGNOSIS — O134 Gestational [pregnancy-induced] hypertension without significant proteinuria, complicating childbirth: Secondary | ICD-10-CM | POA: Diagnosis present

## 2021-07-03 DIAGNOSIS — O321XX0 Maternal care for breech presentation, not applicable or unspecified: Secondary | ICD-10-CM | POA: Diagnosis present

## 2021-07-03 LAB — CBC
HCT: 28.6 % — ABNORMAL LOW (ref 36.0–46.0)
HCT: 27.4 % — ABNORMAL LOW (ref 36.0–46.0)
HCT: 26 % — ABNORMAL LOW (ref 36.0–46.0)
Hemoglobin: 9.4 g/dL — ABNORMAL LOW (ref 12.0–15.0)
Hemoglobin: 8.9 g/dL — ABNORMAL LOW (ref 12.0–15.0)
Hemoglobin: 8.4 g/dL — ABNORMAL LOW (ref 12.0–15.0)
MCH: 28.5 pg (ref 26.0–34.0)
MCH: 28.3 pg (ref 26.0–34.0)
MCH: 28.3 pg (ref 26.0–34.0)
MCHC: 32.9 g/dL (ref 30.0–36.0)
MCHC: 32.5 g/dL (ref 30.0–36.0)
MCHC: 32.3 g/dL (ref 30.0–36.0)
MCV: 86.7 fL (ref 80.0–100.0)
MCV: 87 fL (ref 80.0–100.0)
MCV: 87.5 fL (ref 80.0–100.0)
Platelets: 222 10*3/uL (ref 150–400)
Platelets: 214 10*3/uL (ref 150–400)
Platelets: 192 10*3/uL (ref 150–400)
RBC: 3.3 MIL/uL — ABNORMAL LOW (ref 3.87–5.11)
RBC: 3.15 MIL/uL — ABNORMAL LOW (ref 3.87–5.11)
RBC: 2.97 MIL/uL — ABNORMAL LOW (ref 3.87–5.11)
RDW: 13.2 % (ref 11.5–15.5)
RDW: 13.4 % (ref 11.5–15.5)
RDW: 13.3 % (ref 11.5–15.5)
WBC: 16 10*3/uL — ABNORMAL HIGH (ref 4.0–10.5)
WBC: 15.3 10*3/uL — ABNORMAL HIGH (ref 4.0–10.5)
WBC: 17.9 10*3/uL — ABNORMAL HIGH (ref 4.0–10.5)
nRBC: 0 % (ref 0.0–0.2)
nRBC: 0 % (ref 0.0–0.2)
nRBC: 0 % (ref 0.0–0.2)

## 2021-07-03 LAB — CBC WITH DIFFERENTIAL/PLATELET
Abs Immature Granulocytes: 0.04 10*3/uL (ref 0.00–0.07)
Basophils Absolute: 0 10*3/uL (ref 0.0–0.1)
Basophils Relative: 0 %
Eosinophils Absolute: 0.1 10*3/uL (ref 0.0–0.5)
Eosinophils Relative: 1 %
HCT: 34.7 % — ABNORMAL LOW (ref 36.0–46.0)
Hemoglobin: 11.2 g/dL — ABNORMAL LOW (ref 12.0–15.0)
Immature Granulocytes: 0 %
Lymphocytes Relative: 24 %
Lymphs Abs: 2.6 10*3/uL (ref 0.7–4.0)
MCH: 28.4 pg (ref 26.0–34.0)
MCHC: 32.3 g/dL (ref 30.0–36.0)
MCV: 88.1 fL (ref 80.0–100.0)
Monocytes Absolute: 0.6 10*3/uL (ref 0.1–1.0)
Monocytes Relative: 6 %
Neutro Abs: 7.4 10*3/uL (ref 1.7–7.7)
Neutrophils Relative %: 69 %
Platelets: 267 10*3/uL (ref 150–400)
RBC: 3.94 MIL/uL (ref 3.87–5.11)
RDW: 13.2 % (ref 11.5–15.5)
WBC: 10.8 10*3/uL — ABNORMAL HIGH (ref 4.0–10.5)
nRBC: 0 % (ref 0.0–0.2)

## 2021-07-03 LAB — COMPREHENSIVE METABOLIC PANEL
ALT: 14 U/L (ref 0–44)
ALT: 13 U/L (ref 0–44)
AST: 28 U/L (ref 15–41)
AST: 27 U/L (ref 15–41)
Albumin: 2.7 g/dL — ABNORMAL LOW (ref 3.5–5.0)
Albumin: 2.1 g/dL — ABNORMAL LOW (ref 3.5–5.0)
Alkaline Phosphatase: 150 U/L — ABNORMAL HIGH (ref 38–126)
Alkaline Phosphatase: 116 U/L (ref 38–126)
Anion gap: 8 (ref 5–15)
Anion gap: 5 (ref 5–15)
BUN: 15 mg/dL (ref 6–20)
BUN: 10 mg/dL (ref 6–20)
CO2: 21 mmol/L — ABNORMAL LOW (ref 22–32)
CO2: 23 mmol/L (ref 22–32)
Calcium: 8.9 mg/dL (ref 8.9–10.3)
Calcium: 8.3 mg/dL — ABNORMAL LOW (ref 8.9–10.3)
Chloride: 106 mmol/L (ref 98–111)
Chloride: 110 mmol/L (ref 98–111)
Creatinine, Ser: 1.06 mg/dL — ABNORMAL HIGH (ref 0.44–1.00)
Creatinine, Ser: 0.92 mg/dL (ref 0.44–1.00)
GFR, Estimated: >60 mL/min (ref >60)
GFR, Estimated: >60 mL/min (ref >60)
Glucose, Bld: 88 mg/dL (ref 70–99)
Glucose, Bld: 104 mg/dL — ABNORMAL HIGH (ref 70–99)
Potassium: 4.1 mmol/L (ref 3.5–5.1)
Potassium: 4.1 mmol/L (ref 3.5–5.1)
Sodium: 135 mmol/L (ref 135–145)
Sodium: 138 mmol/L (ref 135–145)
Total Bilirubin: 0.4 mg/dL (ref 0.3–1.2)
Total Bilirubin: 0.3 mg/dL (ref 0.3–1.2)
Total Protein: 6.3 g/dL — ABNORMAL LOW (ref 6.5–8.1)
Total Protein: 4.9 g/dL — ABNORMAL LOW (ref 6.5–8.1)

## 2021-07-03 LAB — RPR: RPR Ser Ql: NONREACTIVE

## 2021-07-03 LAB — RESP PANEL BY RT-PCR (FLU A&B, COVID) ARPGX2
Influenza A by PCR: NEGATIVE
Influenza B by PCR: NEGATIVE
SARS Coronavirus 2 by RT PCR: NEGATIVE

## 2021-07-03 LAB — TYPE AND SCREEN
ABO/RH(D): A POS
Antibody Screen: NEGATIVE

## 2021-07-03 LAB — PROTEIN / CREATININE RATIO, URINE
Creatinine, Urine: 30.6 mg/dL
Creatinine, Urine: 18.92 mg/dL
Total Protein, Urine: <6 mg/dL
Total Protein, Urine: <6 mg/dL

## 2021-07-03 SURGERY — Surgical Case
Anesthesia: *Unknown

## 2021-07-03 SURGERY — Surgical Case
Anesthesia: Spinal | Wound class: Clean Contaminated

## 2021-07-03 MED ORDER — LABETALOL HCL 5 MG/ML IV SOLN: 80.0000 mg | INTRAVENOUS | Status: DC | PRN

## 2021-07-03 MED ORDER — MAGNESIUM OXIDE -MG SUPPLEMENT 400 (240 MG) MG PO TABS: 400.0000 mg | ORAL_TABLET | Freq: Every day | ORAL | Status: DC | PRN

## 2021-07-03 MED ORDER — ONDANSETRON HCL 4 MG/2ML IJ SOLN
INTRAMUSCULAR | Status: DC | PRN
  Administered 2021-07-03: 4 mg via INTRAVENOUS

## 2021-07-03 MED ORDER — NALBUPHINE HCL 10 MG/ML IJ SOLN: 5.0000 mg | INTRAMUSCULAR | Status: DC | PRN

## 2021-07-03 MED ORDER — ZOLPIDEM TARTRATE 5 MG PO TABS: 5.0000 mg | ORAL_TABLET | Freq: Every evening | ORAL | Status: DC | PRN

## 2021-07-03 MED ORDER — SCOPOLAMINE 1 MG/3DAYS TD PT72
MEDICATED_PATCH | TRANSDERMAL | Status: DC | PRN
  Administered 2021-07-03: 1 via TRANSDERMAL

## 2021-07-03 MED ORDER — MENTHOL 3 MG MT LOZG: 1.0000 | LOZENGE | OROMUCOSAL | Status: DC | PRN

## 2021-07-03 MED ORDER — FENTANYL CITRATE (PF) 100 MCG/2ML IJ SOLN
INTRAMUSCULAR | Status: AC
  Filled 2021-07-03: qty 2

## 2021-07-03 MED ORDER — PRENATAL MULTIVITAMIN CH
1.0000 | ORAL_TABLET | Freq: Every day | ORAL | Status: DC
  Administered 2021-07-03 – 2021-07-06 (×4): 1 via ORAL
  Filled 2021-07-03 (×4): qty 1

## 2021-07-03 MED ORDER — LACTATED RINGERS IV SOLN: INTRAVENOUS | Status: DC

## 2021-07-03 MED ORDER — WITCH HAZEL-GLYCERIN EX PADS: 1.0000 "application " | MEDICATED_PAD | CUTANEOUS | Status: DC | PRN

## 2021-07-03 MED ORDER — SODIUM CHLORIDE 0.9% FLUSH: 3.0000 mL | INTRAVENOUS | Status: DC | PRN

## 2021-07-03 MED ORDER — PHENYLEPHRINE HCL-NACL 20-0.9 MG/250ML-% IV SOLN
INTRAVENOUS | Status: AC
  Filled 2021-07-03: qty 250

## 2021-07-03 MED ORDER — DEXAMETHASONE SODIUM PHOSPHATE 4 MG/ML IJ SOLN
INTRAMUSCULAR | Status: AC
  Filled 2021-07-03: qty 1

## 2021-07-03 MED ORDER — OXYCODONE HCL 5 MG/5ML PO SOLN
5.0000 mg | Freq: Once | ORAL | Status: DC | PRN
Start: 2021-07-03 — End: 2021-07-03

## 2021-07-03 MED ORDER — CEFAZOLIN SODIUM-DEXTROSE 2-4 GM/100ML-% IV SOLN
2.0000 g | INTRAVENOUS | Status: AC
  Administered 2021-07-03: 2 g via INTRAVENOUS
  Filled 2021-07-03: qty 100

## 2021-07-03 MED ORDER — DIPHENHYDRAMINE HCL 25 MG PO CAPS: 25.0000 mg | ORAL_CAPSULE | Freq: Four times a day (QID) | ORAL | Status: DC | PRN

## 2021-07-03 MED ORDER — OXYTOCIN-SODIUM CHLORIDE 30-0.9 UT/500ML-% IV SOLN
INTRAVENOUS | Status: DC | PRN
  Administered 2021-07-03: 400 mL via INTRAVENOUS

## 2021-07-03 MED ORDER — LABETALOL HCL 5 MG/ML IV SOLN: 20.0000 mg | INTRAVENOUS | Status: DC | PRN

## 2021-07-03 MED ORDER — OXYCODONE HCL 5 MG PO TABS: 5.0000 mg | ORAL_TABLET | ORAL | Status: DC | PRN

## 2021-07-03 MED ORDER — SENNOSIDES-DOCUSATE SODIUM 8.6-50 MG PO TABS
2.0000 | ORAL_TABLET | Freq: Every day | ORAL | Status: DC
  Administered 2021-07-04 – 2021-07-05 (×2): 2 via ORAL
  Filled 2021-07-03 (×4): qty 2

## 2021-07-03 MED ORDER — SODIUM CHLORIDE 0.9 % IR SOLN
Status: DC | PRN
  Administered 2021-07-03: 1

## 2021-07-03 MED ORDER — OXYTOCIN-SODIUM CHLORIDE 30-0.9 UT/500ML-% IV SOLN
INTRAVENOUS | Status: AC
  Filled 2021-07-03: qty 500

## 2021-07-03 MED ORDER — SCOPOLAMINE 1 MG/3DAYS TD PT72
1.0000 | MEDICATED_PATCH | Freq: Once | TRANSDERMAL | Status: DC
Start: 2021-07-03 — End: 2021-07-06

## 2021-07-03 MED ORDER — DIPHENHYDRAMINE HCL 50 MG/ML IJ SOLN: 12.5000 mg | INTRAMUSCULAR | Status: DC | PRN

## 2021-07-03 MED ORDER — KETOROLAC TROMETHAMINE 30 MG/ML IJ SOLN
30.0000 mg | Freq: Once | INTRAMUSCULAR | Status: AC
  Administered 2021-07-03: 30 mg via INTRAVENOUS

## 2021-07-03 MED ORDER — SODIUM CHLORIDE 0.9 % IR SOLN
Status: DC | PRN
  Administered 2021-07-03: 1000 mL

## 2021-07-03 MED ORDER — DIPHENHYDRAMINE HCL 25 MG PO CAPS: 25.0000 mg | ORAL_CAPSULE | ORAL | Status: DC | PRN

## 2021-07-03 MED ORDER — NALBUPHINE HCL 10 MG/ML IJ SOLN
5.0000 mg | INTRAMUSCULAR | Status: DC | PRN
Start: 2021-07-03 — End: 2021-07-06

## 2021-07-03 MED ORDER — ONDANSETRON HCL 4 MG/2ML IJ SOLN: 4.0000 mg | Freq: Three times a day (TID) | INTRAMUSCULAR | Status: DC | PRN

## 2021-07-03 MED ORDER — OXYTOCIN-SODIUM CHLORIDE 30-0.9 UT/500ML-% IV SOLN
2.5000 [IU]/h | INTRAVENOUS | Status: AC
  Administered 2021-07-03: 2.5 [IU]/h via INTRAVENOUS
  Filled 2021-07-03: qty 500

## 2021-07-03 MED ORDER — ACETAMINOPHEN 500 MG PO TABS
1000.0000 mg | ORAL_TABLET | Freq: Four times a day (QID) | ORAL | Status: DC
  Administered 2021-07-03 – 2021-07-06 (×12): 1000 mg via ORAL
  Filled 2021-07-03 (×12): qty 2

## 2021-07-03 MED ORDER — DEXAMETHASONE SODIUM PHOSPHATE 4 MG/ML IJ SOLN
INTRAMUSCULAR | Status: DC | PRN
  Administered 2021-07-03: 4 mg via INTRAVENOUS

## 2021-07-03 MED ORDER — NALBUPHINE HCL 10 MG/ML IJ SOLN: 5.0000 mg | Freq: Once | INTRAMUSCULAR | Status: DC | PRN

## 2021-07-03 MED ORDER — DIBUCAINE (PERIANAL) 1 % EX OINT: 1.0000 "application " | TOPICAL_OINTMENT | CUTANEOUS | Status: DC | PRN

## 2021-07-03 MED ORDER — LACTATED RINGERS IV SOLN: INTRAVENOUS | Status: DC | PRN

## 2021-07-03 MED ORDER — MORPHINE SULFATE (PF) 0.5 MG/ML IJ SOLN
INTRAMUSCULAR | Status: DC | PRN
  Administered 2021-07-03: 150 ug via INTRATHECAL

## 2021-07-03 MED ORDER — LABETALOL HCL 5 MG/ML IV SOLN: 40.0000 mg | INTRAVENOUS | Status: DC | PRN

## 2021-07-03 MED ORDER — TETANUS-DIPHTH-ACELL PERTUSSIS 5-2.5-18.5 LF-MCG/0.5 IM SUSY: 0.5000 mL | PREFILLED_SYRINGE | Freq: Once | INTRAMUSCULAR | Status: DC

## 2021-07-03 MED ORDER — SIMETHICONE 80 MG PO CHEW
80.0000 mg | CHEWABLE_TABLET | Freq: Three times a day (TID) | ORAL | Status: DC
  Administered 2021-07-03 – 2021-07-06 (×9): 80 mg via ORAL
  Filled 2021-07-03 (×10): qty 1

## 2021-07-03 MED ORDER — SIMETHICONE 80 MG PO CHEW
80.0000 mg | CHEWABLE_TABLET | ORAL | Status: DC | PRN
  Administered 2021-07-05: 80 mg via ORAL
  Filled 2021-07-03: qty 1

## 2021-07-03 MED ORDER — NALOXONE HCL 4 MG/10ML IJ SOLN
1.0000 ug/kg/h | INTRAVENOUS | Status: DC | PRN
  Filled 2021-07-03: qty 5

## 2021-07-03 MED ORDER — BUPIVACAINE IN DEXTROSE 0.75-8.25 % IT SOLN
INTRATHECAL | Status: DC | PRN
  Administered 2021-07-03: 12 mg via INTRATHECAL

## 2021-07-03 MED ORDER — IBUPROFEN 600 MG PO TABS
600.0000 mg | ORAL_TABLET | Freq: Four times a day (QID) | ORAL | Status: AC
  Administered 2021-07-03 – 2021-07-06 (×12): 600 mg via ORAL
  Filled 2021-07-03 (×12): qty 1

## 2021-07-03 MED ORDER — SCOPOLAMINE 1 MG/3DAYS TD PT72
MEDICATED_PATCH | TRANSDERMAL | Status: AC
  Filled 2021-07-03: qty 1

## 2021-07-03 MED ORDER — COCONUT OIL OIL: 1.0000 "application " | TOPICAL_OIL | Status: DC | PRN

## 2021-07-03 MED ORDER — OXYCODONE HCL 5 MG PO TABS: 5.0000 mg | ORAL_TABLET | Freq: Once | ORAL | Status: DC | PRN

## 2021-07-03 MED ORDER — ONDANSETRON HCL 4 MG/2ML IJ SOLN
INTRAMUSCULAR | Status: AC
  Filled 2021-07-03: qty 2

## 2021-07-03 MED ORDER — KETOROLAC TROMETHAMINE 30 MG/ML IJ SOLN: 30.0000 mg | Freq: Once | INTRAMUSCULAR | Status: DC | PRN

## 2021-07-03 MED ORDER — FENTANYL CITRATE (PF) 100 MCG/2ML IJ SOLN
INTRAMUSCULAR | Status: DC | PRN
  Administered 2021-07-03: 15 ug via INTRATHECAL

## 2021-07-03 MED ORDER — HYDRALAZINE HCL 20 MG/ML IJ SOLN: 10.0000 mg | INTRAMUSCULAR | Status: DC | PRN

## 2021-07-03 MED ORDER — ONDANSETRON HCL 4 MG/2ML IJ SOLN: 4.0000 mg | Freq: Once | INTRAMUSCULAR | Status: DC | PRN

## 2021-07-03 MED ORDER — HYDROMORPHONE HCL 1 MG/ML IJ SOLN: 0.2500 mg | INTRAMUSCULAR | Status: DC | PRN

## 2021-07-03 MED ORDER — NALOXONE HCL 0.4 MG/ML IJ SOLN: 0.4000 mg | INTRAMUSCULAR | Status: DC | PRN

## 2021-07-03 MED ORDER — KETOROLAC TROMETHAMINE 30 MG/ML IJ SOLN
INTRAMUSCULAR | Status: AC
  Filled 2021-07-03: qty 1

## 2021-07-03 MED ORDER — LABETALOL HCL 5 MG/ML IV SOLN
INTRAVENOUS | Status: AC
  Filled 2021-07-03: qty 4

## 2021-07-03 MED ORDER — PHENYLEPHRINE HCL-NACL 20-0.9 MG/250ML-% IV SOLN
INTRAVENOUS | Status: DC | PRN
  Administered 2021-07-03: 30 ug/min via INTRAVENOUS

## 2021-07-03 MED ORDER — MORPHINE SULFATE (PF) 0.5 MG/ML IJ SOLN
INTRAMUSCULAR | Status: AC
  Filled 2021-07-03: qty 10

## 2021-07-03 MED ORDER — SOD CITRATE-CITRIC ACID 500-334 MG/5ML PO SOLN
30.0000 mL | ORAL | Status: AC
  Administered 2021-07-03: 30 mL via ORAL
  Filled 2021-07-03: qty 30

## 2021-07-03 SURGICAL SUPPLY — 38 items
APL SKNCLS STERI-STRIP NONHPOA (GAUZE/BANDAGES/DRESSINGS) ×1
BENZOIN TINCTURE PRP APPL 2/3 (GAUZE/BANDAGES/DRESSINGS) ×1 IMPLANT
CHLORAPREP W/TINT 26ML (MISCELLANEOUS) ×2 IMPLANT
CLAMP CORD UMBIL (MISCELLANEOUS) IMPLANT
CLOSURE STERI STRIP 1/2 X4 (GAUZE/BANDAGES/DRESSINGS) ×1 IMPLANT
CLOTH BEACON ORANGE TIMEOUT ST (SAFETY) ×2 IMPLANT
DRAPE C SECTION CLR SCREEN (DRAPES) ×2 IMPLANT
DRSG OPSITE POSTOP 4X10 (GAUZE/BANDAGES/DRESSINGS) ×2 IMPLANT
ELECT REM PT RETURN 9FT ADLT (ELECTROSURGICAL) ×2
ELECTRODE REM PT RTRN 9FT ADLT (ELECTROSURGICAL) ×1 IMPLANT
EXTRACTOR VACUUM KIWI (MISCELLANEOUS) IMPLANT
GLOVE BIO SURGEON STRL SZ 6.5 (GLOVE) ×2 IMPLANT
GLOVE BIOGEL PI IND STRL 7.0 (GLOVE) ×2 IMPLANT
GLOVE BIOGEL PI INDICATOR 7.0 (GLOVE) ×2
GOWN STRL REUS W/TWL LRG LVL3 (GOWN DISPOSABLE) ×4 IMPLANT
KIT ABG SYR 3ML LUER SLIP (SYRINGE) IMPLANT
NDL HYPO 25X5/8 SAFETYGLIDE (NEEDLE) IMPLANT
NEEDLE HYPO 25X5/8 SAFETYGLIDE (NEEDLE) IMPLANT
NS IRRIG 1000ML POUR BTL (IV SOLUTION) ×2 IMPLANT
PACK C SECTION WH (CUSTOM PROCEDURE TRAY) ×2 IMPLANT
PAD OB MATERNITY 4.3X12.25 (PERSONAL CARE ITEMS) ×2 IMPLANT
RETRACTOR WND ALEXIS 25 LRG (MISCELLANEOUS) ×1 IMPLANT
RTRCTR C-SECT PINK 25CM LRG (MISCELLANEOUS) IMPLANT
RTRCTR WOUND ALEXIS 25CM LRG (MISCELLANEOUS) ×2
STRIP CLOSURE SKIN 1/2X4 (GAUZE/BANDAGES/DRESSINGS) IMPLANT
SUT CHROMIC 1 CTX 36 (SUTURE) ×4 IMPLANT
SUT PLAIN 0 NONE (SUTURE) IMPLANT
SUT PLAIN 2 0 XLH (SUTURE) ×2 IMPLANT
SUT VIC AB 0 CT1 27 (SUTURE) ×4
SUT VIC AB 0 CT1 27XBRD ANBCTR (SUTURE) ×2 IMPLANT
SUT VIC AB 2-0 CT1 27 (SUTURE) ×2
SUT VIC AB 2-0 CT1 TAPERPNT 27 (SUTURE) ×1 IMPLANT
SUT VIC AB 3-0 CT1 27 (SUTURE)
SUT VIC AB 3-0 CT1 TAPERPNT 27 (SUTURE) IMPLANT
SUT VIC AB 4-0 KS 27 (SUTURE) ×2 IMPLANT
TOWEL OR 17X24 6PK STRL BLUE (TOWEL DISPOSABLE) ×2 IMPLANT
TRAY FOLEY W/BAG SLVR 14FR LF (SET/KITS/TRAYS/PACK) ×2 IMPLANT
WATER STERILE IRR 1000ML POUR (IV SOLUTION) ×2 IMPLANT

## 2021-07-03 NOTE — MAU Provider Note (Signed)
None     [name] is a 25 y.o. G1P0 patient who presents to MAU today with complaint of leaking fluid since 9pm last night, 07/02/21. She reports mild irregular cramping.  Hx significant for GHTN, breech position related to a uterine septum with planned cesarean scheduled on 07/05/21.    O BP (!) 144/99   Pulse 90   Temp 98.4 F (36.9 C) (Oral)   Resp 16   Ht 5\' 7"  (1.702 m)   Wt 56.7 kg   LMP 10/07/2020 (Exact Date)   SpO2 99%   BMI 19.58 kg/m  Physical Exam Vitals and nursing note reviewed.  Constitutional:      Appearance: She is well-developed.  Cardiovascular:     Rate and Rhythm: Normal rate.  Pulmonary:     Effort: Pulmonary effort is normal.  Abdominal:     Palpations: Abdomen is soft.  Musculoskeletal:        General: Normal range of motion.     Cervical back: Normal range of motion.  Skin:    General: Skin is warm and dry.  Neurological:     Mental Status: She is alert and oriented to person, place, and time.  Psychiatric:        Behavior: Behavior normal.        Thought Content: Thought content normal.        Judgment: Judgment normal.   Dilation: 6 Effacement (%): 100 Station: -1 Exam by:: [email or web address], RN  A Medical screening exam complete PROM at [redacted]w[redacted]d with onset of active labor Breech presentation with uterine septum GHTN  P RN to call Dr [redacted]w[redacted]d, attending for [facility] Admit to [facility] OR for cesarean section  [facility], CNM 07/03/2021 1:42 AM

## 2021-07-03 NOTE — Anesthesia Postprocedure Evaluation (Signed)
Anesthesia Post Note  Patient: Lindsay Copeland  Procedure(s) Performed: CESAREAN SECTION     Patient location during evaluation: Mother Baby Anesthesia Type: Spinal Level of consciousness: oriented and awake and alert Pain management: pain level controlled Vital Signs Assessment: post-procedure vital signs reviewed and stable Respiratory status: spontaneous breathing and respiratory function stable Cardiovascular status: blood pressure returned to baseline and stable Postop Assessment: no headache, no backache, no apparent nausea or vomiting and able to ambulate Anesthetic complications: no   No notable events documented.  Last Vitals:  Vitals:   07/03/21 0545 07/03/21 0546  BP: (!) 149/97   Pulse: 90 88  Resp: 14 (!) 21  Temp: 36.6 C   SpO2: 100% 100%    Last Pain:  Vitals:   07/03/21 0545  TempSrc: Axillary  PainSc:    Pain Goal: Patients Stated Pain Goal: 0 (07/03/21 0035)  LLE Motor Response: No movement due to regional block (07/03/21 0545) LLE Sensation: Numbness (07/03/21 0545) RLE Motor Response: No movement due to regional block (07/03/21 0545) RLE Sensation: Numbness (07/03/21 0545)     Epidural/Spinal Function Cutaneous sensation: Able to Discern Pressure (07/03/21 0545), Patient able to flex knees: No (07/03/21 0545), Patient able to lift hips off bed: No (07/03/21 0545), Back pain beyond tenderness at insertion site: No (07/03/21 0545), Progressively worsening motor and/or sensory loss: No (07/03/21 0545), Bowel and/or bladder incontinence post epidural: No (07/03/21 0545)  Trevor Iha

## 2021-07-03 NOTE — Lactation Note (Signed)
This note was copied from a baby's chart. Lactation Consultation Note  Patient Name: Lindsay Copeland [id number] Date: 07/03/2021 Reason for consult: Initial assessment;Primapara;1st time breastfeeding;Early term 37-38.6wks Age:26 hours  RN in the room assessing baby, baby resting in bed, dad at bedside. Mom reports baby has latched to the breast twice, denies pain at breast. Mom noted with scab like growths to nipples bilat, mom states these were present prior to delivery, and some have come off. Mom interested in employee pump, will notify RN and provide insurance card/ number when available.  LC taught hand expression, feeding frequency, collection and storage of EBM, skin to skin, reviewed feeding log. Attempted to latch baby to breast but baby without hunger cues. Baby noted with raspy cry, was consoled by skin to skin.   Plan - feed on cue 8-12 times a day - wake if >3-4hrs since last feeding - continuous skin to skin while wake and alert - hand express before/after feeds and offer back to baby - offer EBM if baby with difficulty latching - call for LC support if needed prior to next visit  Maternal Data Has patient been taught Hand Expression?: Yes Does the patient have breastfeeding experience prior to this delivery?: No  Feeding Mother's Current Feeding Choice: Breast Milk  LATCH Score Latch: Too sleepy or reluctant, no latch achieved, no sucking elicited.  Audible Swallowing: None  Type of Nipple: Everted at rest and after stimulation  Comfort (Breast/Nipple): Soft / non-tender  Hold (Positioning): No assistance needed to correctly position infant at breast.  LATCH Score: 6   Interventions Interventions: Breast feeding basics reviewed;Assisted with latch;Skin to skin;Breast massage;Hand express;Expressed milk  Discharge WIC Program: No  Consult Status Consult Status: Follow-up Date: 07/04/21 Follow-up type: In-patient    Charlynn Court 07/03/2021, 11:58  AM

## 2021-07-03 NOTE — Anesthesia Procedure Notes (Signed)
Spinal  Patient location during procedure: OB Start time: 07/03/2021 3:08 AM End time: 07/03/2021 3:11 AM Reason for block: surgical anesthesia Staffing Performed: anesthesiologist  Anesthesiologist: Trevor Iha, MD Preanesthetic Checklist Completed: patient identified, IV checked, risks and benefits discussed, surgical consent, monitors and equipment checked, pre-op evaluation and timeout performed Spinal Block Patient position: sitting Prep: DuraPrep and site prepped and draped Patient monitoring: heart rate, cardiac monitor, continuous pulse ox and blood pressure Approach: midline Location: L3-4 Injection technique: single-shot Needle Needle type: Pencan  Needle gauge: 24 G Needle length: 10 cm Needle insertion depth: 4 cm Assessment Sensory level: T4 Events: CSF return Additional Notes  1 Attempt (s). Pt tolerated procedure well.

## 2021-07-03 NOTE — H&P (Addendum)
[address] [name] is a 25 y.o.prime female presenting at 24 weels gestation with c/o LOF. Pt confirmed to have SROM in MAU and to be 6 cm dilated. Pt not appreciating contractions. Baby in known breech position; reconfirmed in MAU via [place].   She was recently diagnosed with gestational hypertension and also noted to have uterine septum vs synechia during pregnancy.  She was followed by MFM.   Pt is GBS negative. She was dated per 7 week [place].    OB History     Gravida  1   Para      Term      Preterm      AB      Living         SAB      IAB      Ectopic      Multiple      Live Births             Past Medical History:  Diagnosis Date   Migraines    Past Surgical History:  Procedure Laterality Date   WISDOM TOOTH EXTRACTION     Family History: family history includes Bone cancer in her father; Diabetes in her father, maternal grandfather, maternal grandmother, paternal grandfather, and paternal grandmother; Healthy in her brother, mother, and sister. Social History:  reports that she has never smoked. She has never used smokeless tobacco. She reports that she does not drink alcohol and does not use drugs.     Maternal Diabetes: No Genetic Screening: Normal Maternal Ultrasounds/Referrals: Normal Fetal Ultrasounds or other Referrals:  Referred to Materal Fetal Medicine  Maternal Substance Abuse:  No Significant Maternal Medications:  None Significant Maternal Lab Results:  Group B Strep negative Other Comments:  None  Review of Systems  Constitutional:  Negative for activity change and fatigue.  Eyes:  Negative for photophobia and visual disturbance.  Respiratory:  Negative for chest tightness and shortness of breath.   Cardiovascular:  Negative for chest pain, palpitations and leg swelling.  Gastrointestinal:  Negative for abdominal pain.  Genitourinary:  Negative for vaginal bleeding.  Musculoskeletal:  Negative for myalgias.  Neurological:   Negative for light-headedness and headaches.  Psychiatric/Behavioral:  Negative for sleep disturbance.   Maternal Medical History:  Reason for admission: Rupture of membranes.   Contractions: Frequency: rare.   Perceived severity is mild.   Fetal activity: Perceived fetal activity is normal.   Prenatal Complications - Diabetes: none.  Dilation: 6 Effacement (%): 100 Station: -1 Exam by:: San Jetty, RN Blood pressure (!) 144/99, pulse 90, temperature 98.4 F (36.9 C), temperature source Oral, resp. rate 16, height 5\' 7"  (1.702 m), weight 56.7 kg, last menstrual period 10/07/2020, SpO2 99 %. Maternal Exam:  Uterine Assessment: Contraction strength is mild.  Abdomen: Patient reports generalized tenderness.  Introitus: Normal vulva. Pelvis: of concern for delivery.    Physical Exam Vitals and nursing note reviewed. Exam conducted with a chaperone present.  Constitutional:      Appearance: Normal appearance. She is normal weight.  Pulmonary:     Effort: Pulmonary effort is normal.  Abdominal:     Tenderness: generalized abdominal tenderness  Genitourinary:    General: Normal vulva.  Musculoskeletal:     Cervical back: Normal range of motion.  Skin:    General: Skin is warm.     Capillary Refill: Capillary refill takes 2 to 3 seconds.  Neurological:     General: No focal deficit present.     Mental Status:  She is alert and oriented to person, place, and time. Mental status is at baseline.  Psychiatric:        Mood and Affect: Mood normal.        Behavior: Behavior normal.        Thought Content: Thought content normal.    Prenatal labs: ABO, Rh: --/--/A POS (09/04 0145) Antibody: NEG (09/04 0145) Rubella: Immune (03/11 0000) RPR: Nonreactive (03/11 0000)  HBsAg: Negative (03/11 0000)  HIV: Non-reactive (03/11 0000)  GBS:     Assessment/Plan: 25yo prime at term with SROM with baby in breech position, GHTN, uterine structural abnormality -Admit  - ERAS protocol -  Monitor BP  - Verify consent  - Covid negative To OR when ready   Primary OBGYN Dr. Theda Belfast 07/03/2021, 3:11 AM

## 2021-07-03 NOTE — MAU Note (Addendum)
Water broke at 9 pm, clear watery.  Contractions every 8-10 min. No recent cervical exam.  C/S for breech planned for this Wednesday. No bleeding. Baby moving well. Has a septum in uterus, 'heart shaped uterus'. goes to MFM to check growth of baby, growth has been good. Has hypertension in pregnancy, monitors blood pressures, no bp medication.

## 2021-07-03 NOTE — Progress Notes (Signed)
Subjective: Postpartum Day 0: Cesarean Delivery Patient reports tolerating PO and + flatus.  She denies HA, blurry vision, CP or SOB. She is bonding well with baby and working on breastfeeding. Lochia mild  Objective: Vital signs in last 24 hours: Temp:  [96.4 F (35.8 C)-98.4 F (36.9 C)] 98.2 F (36.8 C) (09/04 0814) Pulse Rate:  [73-94] 81 (09/04 0814) Resp:  [13-25] 18 (09/04 0814) BP: (132-156)/(88-110) 149/96 (09/04 0814) SpO2:  [98 %-100 %] 99 % (09/04 0915) Weight:  [56.7 kg] 56.7 kg (09/04 0038)  Physical Exam:  General: alert, cooperative, and no distress Lochia: appropriate Uterine Fundus: firm Incision: no significant drainage DVT Evaluation: No evidence of DVT seen on physical exam.  Recent Labs    07/03/21 0648 07/03/21 0850  HGB 9.4* 8.9*  HCT 28.6* 27.4*    Assessment/Plan: Status post Cesarean section. Doing well postoperatively.  BP elevated but preE labs wnl and pt asymptomatic. Will start on procardia 30mg  XL.  Monitor BP q 4 hrs Routine pp/post op care.  07/03/2021, 10:36 AM

## 2021-07-03 NOTE — Op Note (Signed)
Operative Note    Preoperative Diagnosis IUP at 37 0/7wks  Breech presentation GHTN Uterine septum vs synechia   Postoperative Diagnosis IUP at 37 0/7wks  Breech presentation GHTN Uterine fundal septum    Procedure: Primary low transverse cesarean section with double layered uterine closure    Surgeon: Britt Bottom, DO  Anesthesia: Spinal   Fluids: LR EBL: UOP:   Findings: Viable female infant in breech presentation with back down and oblique lie.  APGARS: 8,9; weight pending Uterus with fundal septum noted and filmy adhesions over surface of uterus. Grossly normal tubes and ovaries   Specimen: Placenta to pathology   Indication: Pt presented to MAU at 6cm and with SROM with baby in breech presentation. Pt was agreeable to delivery via cesarean section having been counseled in office previously. She was also noted to have gestational hypertension. Consent was obtained and all questions answered.   Procedure Note    Patient was taken to the operating room where spinal anesthesia was administered. She was prepped and draped in the normal sterile fashion and placed in the dorsal supine position with a leftward tilt. An appropriate time out was performed.   A Pfannenstiel skin incision was then made ttwo finger breaths above her pubic symphysis carried through to the underlying layer of fascia by sharp dissection and Bovie cautery. The fascia was nicked in the midline and the incision was extended laterally with Mayo scissors. The superior and inferior aspects of the incision were grasped kocher clamps and dissected off the underlying rectus muscles.Rectus muscles were separated in the midline  and the peritoneal cavity entered bluntly. The peritoneal was noted to be adherent in parts to the anterior surface of the uterus and sharp dissection was needed in addition to blunt dissection to reduce the adhesions and thus have unimpeded access to the low uterine  segment. Hemostasis was noted.   The Alexis self-retaining wound retractor was then placed and the lower uterine segment exposed. The bladder flap was developed with Metzenbaum scissors and pushed away from the lower uterine segment.  The lower uterine segment was then incised in a transverse fashion and the cavity itself entered bluntly. The baby was noted to be in an oblique breech position with fetal head in the left upper quadrant of the uterus and fetal bottom in the right lower quadrant of the uterus. The baby was successfully rotated to effect a frank breech delivery. Delivery was effected to the level of the scapula then, both legs were flexed and successfully delivered. Next the left arm was swept across infants chest and delivered. The infant was rotated and the right arm delivered. Using the smelly vite technique, the infant's head was then lifted and delivered without difficulty. Vigorous spontaneous cry was noted upon delivery.    Bulb suction was performed of the mouth and nose. The cord was clamped and cut after a minute delay ant the infant handed off to the waiting NICU team per protocol.   Cord blood was obtained and the placenta delivered with trailing membranes noted. The uterus was examined and note was made of a fundal uterine septum with pregnancy and placenta having been in the left uterine cornua. There was some brisk bleeding that improved with removal of residual trailing membranes. Decision was made to not reduce the septum at this time as it was likely to trigger further bleeding.  The uterine incision was then repaired in 2 layers the first layer was a running locked layer 1-0  chromic and the second an imbricating layer of the same suture. The tubes and ovaries were inspected and the gutters cleared of all clots and debris. The uterine incision was inspected again and found to be hemostatic.   All instruments and sponges were then removed from the abdomen. The peritoneum and  rectus muscles were then reapproximated in a running fashion with 2-0 Vicryl sutures.  The fascia was then closed with 0 Vicryl in a running fashion. Subcutaneous tissue was reapproximated with 3-0 plain in a running fashion. The skin was closed with a subcuticular stitch of 4-0 Vicryl on a Keith needle and then reinforced with benzoin and Steri-Strips.  At the conclusion of the procedure all instruments and sponge counts were correct. Patient was taken to the recovery room in good condition with her baby accompanying her skin to skin.

## 2021-07-03 NOTE — Transfer of Care (Signed)
Immediate Anesthesia Transfer of Care Note  Patient: Lindsay Copeland  Procedure(s) Performed: CESAREAN SECTION  Patient Location: PACU  Anesthesia Type:Spinal  Level of Consciousness: awake, alert  and patient cooperative  Airway & Oxygen Therapy: Patient Spontanous Breathing  Post-op Assessment: Report given to RN and Post -op Vital signs reviewed and stable  Post vital signs: Reviewed and stable  Last Vitals:  Vitals Value Taken Time  BP 138/98 07/03/21 0441  Temp    Pulse 82 07/03/21 0445  Resp 12 07/03/21 0445  SpO2 99 % 07/03/21 0445  Vitals shown include unvalidated device data.  Last Pain:  Vitals:   07/03/21 0039  TempSrc: Oral  PainSc:       Patients Stated Pain Goal: 0 (07/03/21 0035)  Complications: No notable events documented.

## 2021-07-03 NOTE — Anesthesia Preprocedure Evaluation (Signed)
Anesthesia Evaluation  Patient identified by MRN, date of birth, ID band Patient awake    Reviewed: Allergy & Precautions, NPO status , Patient's Chart, lab work & pertinent test results  Airway Mallampati: I  TM Distance: >3 FB Neck ROM: Full    Dental no notable dental hx. (+) Teeth Intact, Dental Advisory Given   Pulmonary neg pulmonary ROS,    Pulmonary exam normal breath sounds clear to auscultation       Cardiovascular Exercise Tolerance: Good Normal cardiovascular exam Rhythm:Regular Rate:Normal     Neuro/Psych  Headaches,    GI/Hepatic negative GI ROS, Neg liver ROS,   Endo/Other  negative endocrine ROS  Renal/GU negative Renal ROS     Musculoskeletal   Abdominal   Peds  Hematology Lab Results      Component                Value               Date                      WBC                      10.8 (H)            07/03/2021                HGB                      11.2 (L)            07/03/2021                HCT                      34.7 (L)            07/03/2021                MCV                      88.1                07/03/2021                PLT                      267                 07/03/2021              Anesthesia Other Findings   Reproductive/Obstetrics (+) Pregnancy                             Anesthesia Physical Anesthesia Plan  ASA: 3  Anesthesia Plan: Spinal   Post-op Pain Management:    Induction:   PONV Risk Score and Plan: 3 and Treatment may vary due to age or medical condition and Ondansetron  Airway Management Planned: Natural Airway and Nasal Cannula  Additional Equipment: None  Intra-op Plan:   Post-operative Plan:   Informed Consent: I have reviewed the patients History and Physical, chart, labs and discussed the procedure including the risks, benefits and alternatives for the proposed anesthesia with the patient or authorized representative  who has indicated his/her understanding and acceptance.     Dental advisory given  Plan Discussed  with: CRNA  Anesthesia Plan Comments: (37 wk prima gravida w gHtn and breech presentation for primary C/S)        Anesthesia Quick Evaluation

## 2021-07-04 DIAGNOSIS — D62 Acute posthemorrhagic anemia: Secondary | ICD-10-CM

## 2021-07-04 MED ORDER — FERROUS GLUCONATE 324 (38 FE) MG PO TABS
324.0000 mg | ORAL_TABLET | Freq: Every day | ORAL | Status: DC
  Administered 2021-07-04 – 2021-07-06 (×3): 324 mg via ORAL
  Filled 2021-07-04 (×6): qty 1

## 2021-07-04 NOTE — Progress Notes (Addendum)
Subjective: Postpartum Day 1: Cesarean Delivery Patient reports tolerating PO and no problems voiding.  She is ambulating fine with no dizziness.  Working on breastfeeding  Objective: Vital signs in last 24 hours: Temp:  [98 F (36.7 C)-98.4 F (36.9 C)] 98 F (36.7 C) (09/04 2115) Pulse Rate:  [79-102] 83 (09/05 0553) Resp:  [17-18] 18 (09/04 2115) BP: (121-149)/(81-96) 136/87 (09/05 0553) SpO2:  [97 %-100 %] 99 % (09/05 0553)  Physical Exam:  General: alert and cooperative Lochia: appropriate Uterine Fundus: firm Incision: C/D/I--minimal drainage on dressing   Recent Labs    07/03/21 0850 07/03/21 1245  HGB 8.9* 8.4*  HCT 27.4* 26.0*    Assessment/Plan: Status post Cesarean section. Doing well postoperatively.  Continue current care.  Add oral iron, tolerating acute blood loss anemia. Plan for discharge tomorrow Will increase BP checks to q 4 hours to see if needs BP medication coverage  Oliver Pila 07/04/2021, 7:58 AM

## 2021-07-04 NOTE — Progress Notes (Signed)
Patient ID: Lindsay Copeland, female   DOB: 1995/12/10, 25 y.o.   MRN: [id number] Spoke to nurse. Pt never got procardia yesterday.   Last BP was 121/91 @ 2115 last night.   Repeat BP now is : 128/88   Continue to monitor without procardia for now

## 2021-07-04 NOTE — Lactation Note (Addendum)
This note was copied from a baby's chart. Lactation Consultation Note  Patient Name: Lindsay Copeland [id number] Date: 07/04/2021 Reason for consult: Follow-up assessment;Mother's request;Difficult latch;Primapara;1st time breastfeeding;Early term 37-38.6wks;Infant weight loss Age:26 hours Infant has heart shaped tongue but can extend tongue pass the gumline. Infant impatient for flow at breast. Mom can offer ebm from bottle then try latching when she is calmer. Adequate urine and stool output and stool transitioning  Plan 1. To feed based on cues 8-12 in 24 hr period.  2. Mom to offer breast and listen for audible swallows.  3. Mom to supplement with EBM pace bottle feeding with extra slow flow nipple 7-12 ml after latching. Mom to offer more if infant still cueing.  4. DEBP q 3hrs for   All questions answered at the end of the visit.   Maternal Data Has patient been taught Hand Expression?: Yes  Feeding Mother's Current Feeding Choice: Breast Milk Nipple Type: Extra Slow Flow  LATCH Score Latch: Repeated attempts needed to sustain latch, nipple held in mouth throughout feeding, stimulation needed to elicit sucking reflex.  Audible Swallowing: Spontaneous and intermittent  Type of Nipple: Everted at rest and after stimulation  Comfort (Breast/Nipple): Soft / non-tender  Hold (Positioning): Assistance needed to correctly position infant at breast and maintain latch.  LATCH Score: 8   Lactation Tools Discussed/Used Tools: Pump Breast pump type: Double-Electric Breast Pump Pump Education: Setup, frequency, and cleaning;Milk Storage Reason for Pumping: increase stimulation Pumping frequency: every 3 hrs for 15 min  Interventions Interventions: Breast feeding basics reviewed;Breast compression;Assisted with latch;Adjust position;Skin to skin;Support pillows;DEBP;Breast massage;Position options;Hand express;Expressed milk;Education;Pace feeding  Discharge    Consult  Status Consult Status: Follow-up Date: 07/05/21 Follow-up type: In-patient    Benjamen Koelling  Nicholson-Springer 07/04/2021, 3:41 PM

## 2021-07-05 ENCOUNTER — Encounter (HOSPITAL_COMMUNITY)
Admission: RE | Admit: 2021-07-05 | Discharge: 2021-07-05 | Disposition: A | Discharge status: Home / Self Care | Payer: No Typology Code available for payment source | Source: Ambulatory Visit | Attending: Obstetrics and Gynecology | Admitting: Obstetrics and Gynecology

## 2021-07-05 MED ORDER — OXYCODONE HCL 5 MG PO TABS: 5.0000 mg | ORAL_TABLET | ORAL | 0 refills | Status: DC | PRN

## 2021-07-05 NOTE — Discharge Summary (Signed)
Postpartum Discharge Summary     Patient Name: Lindsay Copeland DOB: [birth year] MRN: [id number]  Date of admission: 07/03/2021 Delivery date:07/03/2021  Delivering provider: Sherlyn Hay  Date of discharge: 07/05/2021  Admitting diagnosis: Breech presentation [O32.1XX0] Intrauterine pregnancy: [redacted]w[redacted]d     Secondary diagnosis:  Active Problems:   Breech presentation   Status post primary low transverse cesarean section   Septate uterus affecting pregnancy in third trimester   Acute blood loss anemia  Additional problems: uterine septum, GHTN, anemia    Discharge diagnosis: Term Pregnancy Delivered, Gestational Hypertension, and Anemia                                              Post partum procedures: none Augmentation: N/A Complications: None  Hospital course: Onset of Labor With Unplanned C/S   25 y.o. yo G1P1001 at [redacted]w[redacted]d was admitted in Latent Labor on 07/03/2021. Patient had a labor course significant for malpresentation. The patient went for cesarean section due to Malpresentation. Delivery details as follows: Membrane Rupture Time/Date: 9:00 PM ,07/02/2021   Delivery Method:C-Section, Low Transverse  Details of operation can be found in separate operative note. Patient had an uncomplicated postpartum course.  She is ambulating,tolerating a regular diet, passing flatus, and urinating well.  Patient is discharged home in stable condition 07/05/21.  Newborn Data: Birth date:07/03/2021  Birth time:3:44 AM  Gender:Female  Living status:Living  Apgars:8 ,9  Weight:3396 g   Magnesium Sulfate received: No BMZ received: No Rhophylac:N/A MMR:N/A T-DaP: see office note Flu: N/A Transfusion:No  Physical exam  Vitals:   07/04/21 1703 07/04/21 2120 07/05/21 0045 07/05/21 0510  BP: 130/89 128/87 131/84 138/88  Pulse: 99 (!) 101  86  Resp: $Remo'18 18  18  'ZQhTY$ Temp:  98 F (36.7 C)  98.2 F (36.8 C)  TempSrc:    Oral  SpO2:    100%  Weight:      Height:       General:  alert, cooperative, and no distress Lochia: appropriate Uterine Fundus: firm Incision: Healing well with no significant drainage, No significant erythema, Dressing is clean, dry, and intact DVT Evaluation: No evidence of DVT seen on physical exam. Labs: Lab Results  Component Value Date   WBC 17.9 (H) 07/03/2021   HGB 8.4 (L) 07/03/2021   HCT 26.0 (L) 07/03/2021   MCV 87.5 07/03/2021   PLT 192 07/03/2021   CMP Latest Ref Rng & Units 07/03/2021  Glucose 70 - 99 mg/dL 104(H)  BUN 6 - 20 mg/dL 10  Creatinine 0.44 - 1.00 mg/dL 0.92  Sodium 135 - 145 mmol/L 138  Potassium 3.5 - 5.1 mmol/L 4.1  Chloride 98 - 111 mmol/L 110  CO2 22 - 32 mmol/L 23  Calcium 8.9 - 10.3 mg/dL 8.3(L)  Total Protein 6.5 - 8.1 g/dL 4.9(L)  Total Bilirubin 0.3 - 1.2 mg/dL 0.3  Alkaline Phos 38 - 126 U/L 116  AST 15 - 41 U/L 27  ALT 0 - 44 U/L 13   Edinburgh Score: Edinburgh Postnatal Depression Scale Screening Tool 07/04/2021  I have been able to laugh and see the funny side of things. 0  I have looked forward with enjoyment to things. 0  I have blamed myself unnecessarily when things went wrong. 0  I have been anxious or worried for no good reason. 0  I have felt scared  or panicky for no good reason. 0  Things have been getting on top of me. 0  I have been so unhappy that I have had difficulty sleeping. 0  I have felt sad or miserable. 0  I have been so unhappy that I have been crying. 0  The thought of harming myself has occurred to me. 0  Edinburgh Postnatal Depression Scale Total 0      After visit meds:  Allergies as of 07/05/2021   No Known Allergies      Medication List     STOP taking these medications    Butalbital-APAP-Caffeine 50-300-40 MG Caps   magnesium oxide 400 MG tablet Commonly known as: MAG-OX   Nurtec 75 MG Tbdp Generic drug: Rimegepant Sulfate   ondansetron 4 MG disintegrating tablet Commonly known as: Zofran ODT   ondansetron 8 MG disintegrating tablet Commonly  known as: ZOFRAN-ODT   topiramate 25 MG tablet Commonly known as: TOPAMAX       TAKE these medications    oxyCODONE 5 MG immediate release tablet Commonly known as: Oxy IR/ROXICODONE Take 1-2 tablets (5-10 mg total) by mouth every 4 (four) hours as needed for moderate pain.   PRENATAL GUMMIES PO Take 1 each by mouth daily.               Discharge Care Instructions  (From admission, onward)           Start     Ordered   07/05/21 0000  Discharge wound care:       Comments: Can remove dressing and steri strips as indicated in marker on 9/11   07/05/21 0918             Discharge home in stable condition Infant Feeding: Breast Infant Disposition:home with mother Discharge instruction: per After Visit Summary and Postpartum booklet. Activity: Advance as tolerated. Pelvic rest for 6 weeks.  Diet: routine diet Anticipated Birth Control: Unsure Postpartum Appointment:4 weeks Additional Postpartum F/U: Postpartum Depression checkup and BP check with in 1 week Future Appointments: Future Appointments  Date Time Provider [place]  08/12/2021  3:30 PM Pieter Partridge, DO [facility] None   Follow up Visit:  Follow-up Information     Ob/Gyn, [facility] Follow up in 4 week(s).   Contact information: [address] [place] [address] [phone]                     07/05/2021 Allyn Kenner, DO

## 2021-07-05 NOTE — Plan of Care (Signed)
  Problem: Life Cycle: Goal: Chance of risk for complications during the postpartum period will decrease Outcome: Progressing   Problem: Skin Integrity: Goal: Demonstration of wound healing without infection will improve Outcome: Progressing

## 2021-07-06 ENCOUNTER — Inpatient Hospital Stay (HOSPITAL_COMMUNITY): Payer: No Typology Code available for payment source

## 2021-07-06 LAB — SURGICAL PATHOLOGY

## 2021-07-06 NOTE — Lactation Note (Signed)
This note was copied from a baby's chart. Lactation Consultation Note  Patient Name: Lindsay Copeland [id number] Date: 07/06/2021 Reason for consult: Follow-up assessment Age:26 hours Mother reports that she feels that infant is breastfeeding well. Infant is 11 % weight loss. Observed that mother easily hand expresses milk after feeding. Infant latched when I arrived to the room . Assist with flanging infants lips for wider gape. Taught mother to roll top lip upward and do chin tug. Infant sustained latch for more than 20 mins. Mother using cradle hold and encouraged her to use support at base of infants shoulders for cross cradle hold.  Mother reports that she offered infant 14 ml of ebm with a bottle before the feeding.   Suggested that mother hand express and pump every 3 hours for 15 mins after feeding. Mother to continue to supplement infant with EBM and according to supplement guidelines . Mother advised to off infant as much volume as infant will take,  making sure that infant burps after 10-15 ml. then offer more volume if infant desires.  Mother to page for latch check again. Encouraged mother to follow up with [facility] when discharged. Mother reports that she hopes to get [facility] pump today.  Advised mother in treatment and prevention of engorgement.    Maternal Data    Feeding Mother's Current Feeding Choice: Breast Milk  LATCH Score Latch: Repeated attempts needed to sustain latch, nipple held in mouth throughout feeding, stimulation needed to elicit sucking reflex.  Audible Swallowing: Spontaneous and intermittent  Type of Nipple: Everted at rest and after stimulation  Comfort (Breast/Nipple): Soft / non-tender  Hold (Positioning): Assistance needed to correctly position infant at breast and maintain latch.  LATCH Score: 8   Lactation Tools Discussed/Used    Interventions Interventions: Assisted with latch;Skin to skin;Hand express;Breast compression;Adjust  position;Support pillows;Position options;DEBP;Pace feeding;Education  Discharge Discharge Education: Engorgement and breast care;Warning signs for feeding baby;Outpatient recommendation Pump: Stork Pump;Manual (Storck pump ordered)  Consult Status Consult Status: Follow-up Date: 07/06/21 Follow-up type: In-patient    Stevan Born [facility] 07/06/2021, 9:19 AM

## 2021-07-06 NOTE — Progress Notes (Signed)
Patient stayed an extra day due to baby.  Patient is doing well.  She is tolerating PO, ambulating, voiding.  Pain is controlled.  Lochia is appropriate  Vitals:   07/05/21 1100 07/05/21 1446 07/05/21 2004 07/06/21 0504  BP:   134/75 132/85  Pulse: 98 92 89 76  Resp:  18 18 16   Temp:  98.3 F (36.8 C) 98.3 F (36.8 C) 98.1 F (36.7 C)  TempSrc:  Oral Oral Oral  SpO2: 99% 99% 100% 99%  Weight:      Height:        NAD Abdomen:  soft, appropriate tenderness, incisions intact and without erythema or drainage ext:    Symmetric, trace edema bilaterally  Lab Results  Component Value Date   WBC 17.9 (H) 07/03/2021   HGB 8.4 (L) 07/03/2021   HCT 26.0 (L) 07/03/2021   MCV 87.5 07/03/2021   PLT 192 07/03/2021    --/--/A POS (09/04 0145)  A/P    25 y.o. G1P1001 POD 3 s/p primary cesarean section for breech / labor Routine post op and postpartum care.   GHTN:  BPs stable.  Will monitor daily at home and f/u in 1 week for BP check

## 2021-07-08 ENCOUNTER — Ambulatory Visit: Payer: No Typology Code available for payment source

## 2021-07-18 ENCOUNTER — Telehealth (HOSPITAL_COMMUNITY): Payer: Self-pay | Admitting: *Deleted

## 2021-07-18 NOTE — Telephone Encounter (Signed)
Left message to return nurse call.  Duffy Rhody, RN 07-18-2021 at 10:25am

## 2021-08-04 ENCOUNTER — Other Ambulatory Visit (HOSPITAL_COMMUNITY): Payer: Self-pay

## 2021-08-04 MED ORDER — NORETHINDRONE 0.35 MG PO TABS
ORAL_TABLET | ORAL | 3 refills | Status: DC
  Filled 2021-08-04: qty 84, 84d supply, fill #0
  Filled 2021-10-27: qty 84, 84d supply, fill #1

## 2021-08-10 NOTE — Progress Notes (Signed)
NEUROLOGY FOLLOW UP OFFICE NOTE  Lindsay Copeland [id number]  Assessment/Plan:   Migraine with aura, without status migrainosus, not intractable/ Hemiplegic migraine - currently doing well postpartum  Migraine prevention:  defer preventative as she is breastfeeding and doing well - she may consider magnesium citrate/CoQ10/riboflavin Migraine rescue:  Limited due to breastfeeding.  Cannot take triptans due to stroke-like migraine symptoms.  Given lack of human data, would not use Nurtec or other CGRP inhibitor.  Advised to ask pediatrician if taking Fioricet once in awhile is okay.  Otherwise, would only recommend Excedrin Migraine Limit use of pain relievers to no more than 2 days out of week to prevent risk of rebound or medication-overuse headache. Keep headache diary Follow up 6 months.   Subjective:  Lindsay Copeland is a 25 year old female who follows up for migraines.   UPDATE: Last seen a year ago. She found out she was pregnant in February and discontinued topiramate and Nurtec.  She developed increase migraines in March which did not respond to acetaminophen, so she took Fioricet.  Migraines then resolved.  She delivered a baby girl via C-section on 07/03/2021 due to breech.  Currently breastfeeding.  She has history of double vision, often preceding migraine but sometimes occurs independently.  At the end of her pregnancy, she started experiencing the double vision more frequently which lasted up to 2 weeks after giving birth.    Current NSAIDS:  ibuprofen Current analgesics:  acetaminophen, Fioricet Current triptans:  none Current ergotamine:  none Current anti-emetic:  none Current muscle relaxants:  none Current anti-anxiolytic:  none Current sleep aide:  none Current Antihypertensive medications:  none Current Antidepressant medications:  none Current Anticonvulsant medications:  none Current anti-CGRP:  Nurtec Current Vitamins/Herbal/Supplements:  prenatal  vitamins Current Antihistamines/Decongestants:  none Other therapy:  none Hormone/birth control:  norethindrone (just started)   Caffeine:  Coffee not daily Diet:  Needs to increase water intake Exercise:  Walker Depression:  no; Anxiety:  no Other pain:  no Sleep hygiene:  okay   HISTORY:  She started experiencing migraines when she was in middle school.  Typically they have been preceded by numbness and tingling of her face and unilateral extremities (opposite side of headache) as well as double vision.  Headaches are associated with nausea, vomiting, photophobia, phonophobia and unilateral numbness and weakness.  They subsequently resolved in 11th grade.     However, they started to recur in January 2020.  She had one migraine in January.  She started birth control (Blisovi Fe) in February.  Now, she doesn't have double vision but she has now been having blind spots in either eye or flashing spots and zigzag lines for 30-45 minutes prior to headache onset.  It now appears to have dysconjugate gaze (one eye appears not straight).  They typically have been lasting several hours (usually goes to sleep) and has occurred 14 times this past year (most frequent in October - 6 times).  She has a postdrome lasting 1 to 2 days described as head pressure.  She presented to the [facility] ED on 08/30/2019 after waking up in the middle of the night with a migraine.  This migraine was different, as it was accompanied by 15 minutes of confusion in which she felt disoriented in her house. MRI of brain and cervical spine with and without contrast were personally reviewed and demonstrated periventricular T2 hyperintensities in the cerebral white matter, stable compared to prior imaging from 2012, and no abnormalities  of the cervical spine.  She was treated in the ED with IV Reglan and discharged on topiramate. After headache broke, she had trouble with concentration, processing thoughts and word-finding difficulty  for 2 weeks and still not back to baseline. 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  She passed her clinicals but now studying for her boards.  She hasn't had a migraine since the ED visit but has has reported dysconjugate gaze.   Triggers:  Only one headache associated with menses. Relieving factors:  sleep   Past NSAIDS:  ibuprofen Past analgesics:  tramadol, Tylenol, Fioricet Past abortive triptans:  none Past abortive ergotamine:  none Past muscle relaxants:  none Past anti-emetic:  Zofran Past antihypertensive medications:  none Past antidepressant medications:  none Past anticonvulsant medications:  topiramate 25mg  at bedtime (effective but stopped due to pregnancy) Past anti-CGRP:  Nurtec (rescue - effective ) Past vitamins/Herbal/Supplements:  Migraine relieve supplement combination PRN Past antihistamines/decongestants:  none Other past therapies:  none     Family history of headache:  no  PAST MEDICAL HISTORY: Past Medical History:  Diagnosis Date   Migraines     MEDICATIONS: Current Outpatient Medications on File Prior to Visit  Medication Sig Dispense Refill   norethindrone (MICRONOR) 0.35 MG tablet Take 1 tablet by mouth once daily. 84 tablet 3   oxyCODONE (OXY IR/ROXICODONE) 5 MG immediate release tablet Take 1-2 tablets (5-10 mg total) by mouth every 4 (four) hours as needed for moderate pain. 30 tablet 0   Prenatal MV & Min w/FA-DHA (PRENATAL GUMMIES PO) Take 1 each by mouth daily.     No current facility-administered medications on file prior to visit.    ALLERGIES: No Known Allergies  FAMILY HISTORY: Family History  Problem Relation Age of Onset   Healthy Mother    Diabetes Father    Bone cancer Father    Healthy Sister    Healthy Brother    Diabetes Maternal Grandmother    Diabetes Maternal Grandfather    Diabetes Paternal Grandmother    Diabetes Paternal Grandfather       Objective:  Blood  pressure 136/85, pulse (!) 102, height 5\' 7"  (1.702 m), weight 103 lb 6.4 oz (46.9 kg), SpO2 98 %, unknown if currently breastfeeding. General: No acute distress.  Patient appears well-groomed.   Head:  Normocephalic/atraumatic Eyes:  Fundi examined but not visualized Neck: supple, no paraspinal tenderness, full range of motion Heart:  Regular rate and rhythm Lungs:  Clear to auscultation bilaterally Back: No paraspinal tenderness Neurological Exam: alert and oriented to person, place, and time.  Speech fluent and not dysarthric, language intact.  CN II-XII intact. Bulk and tone normal, muscle strength 5/5 throughout.  Sensation to light touch intact.  Deep tendon reflexes 2+ throughout, toes downgoing.  Finger to nose testing intact.  Gait normal, Romberg negative.   , DO  CC: , NP

## 2021-08-12 ENCOUNTER — Encounter: Payer: Self-pay | Admitting: Neurology

## 2021-08-12 ENCOUNTER — Ambulatory Visit (INDEPENDENT_AMBULATORY_CARE_PROVIDER_SITE_OTHER): Payer: 59 | Admitting: Neurology

## 2021-08-12 ENCOUNTER — Other Ambulatory Visit: Payer: Self-pay

## 2021-08-12 VITALS — BP 136/85 | HR 102 | Ht 67.0 in | Wt 103.4 lb

## 2021-08-12 DIAGNOSIS — G43109 Migraine with aura, not intractable, without status migrainosus: Secondary | ICD-10-CM

## 2021-08-12 NOTE — Patient Instructions (Signed)
Consider taking magnesium citrate 400mg  daily, riboflavin 400mg  daily, coenzyme Q10 100mg  three times daily Ask the pediatrician if it would be okay to take Fioricet once in awhile if you have a migraine.  Let me know.  Otherwise, I would just recommend Excedrin Migraine Follow up in 6 months.

## 2021-10-27 ENCOUNTER — Other Ambulatory Visit (HOSPITAL_COMMUNITY): Payer: Self-pay

## 2021-11-30 ENCOUNTER — Other Ambulatory Visit (HOSPITAL_COMMUNITY): Payer: Self-pay

## 2021-11-30 MED ORDER — OFLOXACIN 0.3 % OT SOLN
OTIC | 0 refills | Status: DC
  Filled 2021-11-30: qty 5, 5d supply, fill #0

## 2021-12-02 ENCOUNTER — Other Ambulatory Visit (HOSPITAL_COMMUNITY): Payer: Self-pay

## 2021-12-02 MED ORDER — AMOXICILLIN-POT CLAVULANATE 875-125 MG PO TABS
1.0000 | ORAL_TABLET | Freq: Two times a day (BID) | ORAL | 0 refills | Status: DC
  Filled 2021-12-02: qty 14, 7d supply, fill #0

## 2022-01-18 ENCOUNTER — Other Ambulatory Visit (HOSPITAL_COMMUNITY): Payer: Self-pay

## 2022-03-09 NOTE — Progress Notes (Deleted)
NEUROLOGY FOLLOW UP OFFICE NOTE  Lindsay Copeland [id number]  Assessment/Plan:   Migraine with aura, without status migrainosus, not intractable/ Hemiplegic migraine   Migraine prevention:  defer preventative as she is breastfeeding and doing well - she may consider magnesium citrate/CoQ10/riboflavin Migraine rescue:  Limited due to breastfeeding.  Cannot take triptans due to stroke-like migraine symptoms.  Given lack of human data, would not use Nurtec or other CGRP inhibitor.  Advised to ask pediatrician if taking Fioricet once in awhile is okay.  Otherwise, would only recommend Excedrin Migraine Limit use of pain relievers to no more than 2 days out of week to prevent risk of rebound or medication-overuse headache. Keep headache diary Follow up 6 months.     Subjective:  Lindsay Copeland is a 26 year old female who follows up for migraines.   UPDATE: Currently breastfeeding?   Current NSAIDS:  ibuprofen Current analgesics:  acetaminophen, Fioricet Current triptans:  none Current ergotamine:  none Current anti-emetic:  none Current muscle relaxants:  none Current anti-anxiolytic:  none Current sleep aide:  none Current Antihypertensive medications:  none Current Antidepressant medications:  none Current Anticonvulsant medications:  none Current anti-CGRP:  Nurtec Current Vitamins/Herbal/Supplements:  prenatal vitamins Current Antihistamines/Decongestants:  none Other therapy:  none Hormone/birth control:  norethindrone (just started)   Caffeine:  Coffee not daily Diet:  Needs to increase water intake Exercise:  Walker Depression:  no; Anxiety:  no Other pain:  no Sleep hygiene:  okay   HISTORY:  She started experiencing migraines when she was in middle school.  Typically they have been preceded by numbness and tingling of her face and unilateral extremities (opposite side of headache) as well as double vision.  Headaches are associated with nausea,  vomiting, photophobia, phonophobia and unilateral numbness and weakness.  They subsequently resolved in 11th grade.     However, they started to recur in January 2020.  She had one migraine in January.  She started birth control (Blisovi Fe) in February.  Now, she doesn't have double vision but she has now been having blind spots in either eye or flashing spots and zigzag lines for 30-45 minutes prior to headache onset.  It now appears to have dysconjugate gaze (one eye appears not straight).  They typically have been lasting several hours (usually goes to sleep) and has occurred 14 times this past year (most frequent in October - 6 times).  She has a postdrome lasting 1 to 2 days described as head pressure.  She presented to the [facility] ED on 08/30/2019 after waking up in the middle of the night with a migraine.  This migraine was different, as it was accompanied by 15 minutes of confusion in which she felt disoriented in her house. MRI of brain and cervical spine with and without contrast were personally reviewed and demonstrated periventricular T2 hyperintensities in the cerebral white matter, stable compared to prior imaging from 2012, and no abnormalities of the cervical spine.  She was treated in the ED with IV Reglan and discharged on topiramate. After headache broke, she had trouble with concentration, processing thoughts and word-finding difficulty for 2 weeks and still not back to baseline. 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  She passed her clinicals but now studying for her boards.  She  sometimes occurs independently.    Triggers:  Only one headache associated with menses. Relieving factors:  sleep   Past NSAIDS:  ibuprofen Past  analgesics:  tramadol, Tylenol, Fioricet Past abortive triptans:  none Past abortive ergotamine:  none Past muscle relaxants:  none Past anti-emetic:  Zofran Past antihypertensive medications:  none Past  antidepressant medications:  none Past anticonvulsant medications:  topiramate 25mg  at bedtime (effective but stopped due to pregnancy) Past anti-CGRP:  Nurtec (rescue - effective ) Past vitamins/Herbal/Supplements:  Migraine relieve supplement combination PRN Past antihistamines/decongestants:  none Other past therapies:  none     Family history of headache:  no  PAST MEDICAL HISTORY: Past Medical History:  Diagnosis Date   Migraines     MEDICATIONS: Current Outpatient Medications on File Prior to Visit  Medication Sig Dispense Refill   amoxicillin-clavulanate (AUGMENTIN) 875-125 MG tablet Take 1 tablet by mouth 2 (two) times daily for 7 days 14 tablet 0   norethindrone (MICRONOR) 0.35 MG tablet Take 1 tablet by mouth once daily. 84 tablet 3   ofloxacin (FLOXIN) 0.3 % OTIC solution Place 5 drops into right ear twice a day for 5 days 10 mL 0   oxyCODONE (OXY IR/ROXICODONE) 5 MG immediate release tablet Take 1-2 tablets (5-10 mg total) by mouth every 4 (four) hours as needed for moderate pain. (Patient not taking: Reported on 08/12/2021) 30 tablet 0   Prenatal MV & Min w/FA-DHA (PRENATAL GUMMIES PO) Take 1 each by mouth daily.     No current facility-administered medications on file prior to visit.    ALLERGIES: No Known Allergies  FAMILY HISTORY: Family History  Problem Relation Age of Onset   Healthy Mother    Diabetes Father    Bone cancer Father    Healthy Sister    Healthy Brother    Diabetes Maternal Grandmother    Diabetes Maternal Grandfather    Diabetes Paternal Grandmother    Diabetes Paternal Grandfather       Objective:  *** General: No acute distress.  Patient appears ***-groomed.   Head:  Normocephalic/atraumatic Eyes:  Fundi examined but not visualized Neck: supple, no paraspinal tenderness, full range of motion Heart:  Regular rate and rhythm Lungs:  Clear to auscultation bilaterally Back: No paraspinal tenderness Neurological Exam: alert and  oriented to person, place, and time.  Speech fluent and not dysarthric, language intact.  CN II-XII intact. Bulk and tone normal, muscle strength 5/5 throughout.  Sensation to light touch intact.  Deep tendon reflexes 2+ throughout, toes downgoing.  Finger to nose testing intact.  Gait normal, Romberg negative.   08/14/2021, DO  CC: ***

## 2022-03-10 ENCOUNTER — Ambulatory Visit: Payer: Self-pay | Admitting: Neurology

## 2022-10-17 NOTE — Progress Notes (Signed)
NEUROLOGY FOLLOW UP OFFICE NOTE  Wanna Gully [id number]  Assessment/Plan:   Migraine with aura, without status migrainosus, not intractable Hemiplegic migraine   Migraine prevention:  will restart topiramate 25mg  at bedtime.  She did well on it.  She is thin.  I advised that if she starts having weight loss, then to contact me. Migraine rescue:  Nurtec.  Nurtec effective.  Cannot take triptans due to stroke-like symptoms/hemiplegic migraine.  Zofran OCT 4mg . Limit use of pain relievers to no more than 2 days out of week to prevent risk of rebound or medication-overuse headache. Keep headache diary Recommend discontinuing Junel Fe - should take progestin only birth control in setting of migraine with aura Follow up 4 to 5 months.   Subjective:  Lindsay Copeland is a 26 year old female who follows up for migraines.   UPDATE: No longer breastfeeding.  Doing well (1 migraine every 2 months) until 2 weeks ago.  Since 12/3, she has had 4 migraines.  Unsure what triggered them.  Took samples of Nurtec and migraine aborted in 2 hours. She woke up with migraine once and it aborted in 4-5 hours.  She now is getting the blind spot and numbness prior to onset of headache, so she takes the Nurtec when she gets the aura.  Also has had increased nausea.  For 2 days after the migraine, she has a postdrome of brain fog.  Current NSAIDS:  none Current analgesics:  none Current triptans:  none Current ergotamine:  none Current anti-emetic:  none Current muscle relaxants:  none Current anti-anxiolytic:  none Current sleep aide:  none Current Antihypertensive medications:  none Current Antidepressant medications:  none Current Anticonvulsant medications:  none Current anti-CGRP:  Nurtec (samples) Current Vitamins/Herbal/Supplements:  prenatal vitamins Current Antihistamines/Decongestants:  none Other therapy:  none Hormone/birth control:  Junel Fe   Caffeine:  Coffee not  daily Diet:  Needs to increase water intake Exercise:  Walker Depression:  no; Anxiety:  no Other pain:  no Sleep hygiene:  okay   HISTORY:  She started experiencing migraines when she was in middle school.  Typically they have been preceded by numbness and tingling of her face and unilateral extremities (opposite side of headache) as well as double vision.  Headaches are associated with nausea, vomiting, photophobia, phonophobia and unilateral numbness and weakness.  They subsequently resolved in 11th grade.     However, they started to recur in January 2020.  She had one migraine in January.  She started birth control (Blisovi Fe) in February.  Now, she doesn't have double vision but she has now been having blind spots in either eye or flashing spots and zigzag lines for 30-45 minutes prior to headache onset.  It now appears to have dysconjugate gaze (one eye appears not straight).  They typically have been lasting several hours (usually goes to sleep) and has occurred 14 times this past year (most frequent in October - 6 times).  She has a postdrome lasting 1 to 2 days described as head pressure.  She presented to the [facility] ED on 08/30/2019 after waking up in the middle of the night with a migraine.  This migraine was different, as it was accompanied by 15 minutes of confusion in which she felt disoriented in her house. MRI of brain and cervical spine with and without contrast were personally reviewed and demonstrated periventricular T2 hyperintensities in the cerebral white matter, stable compared to prior imaging from 2012, and no abnormalities  of the cervical spine.  She was treated in the ED with IV Reglan and discharged on topiramate. After headache broke, she had trouble with concentration, processing thoughts and word-finding difficulty for 2 weeks and still not back to baseline. 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  She passed her  clinicals but now studying for her boards.  She hasn't had a migraine since the ED visit but has has reported dysconjugate gaze.   Triggers:  Only one headache associated with menses. Relieving factors:  sleep   Past NSAIDS:  ibuprofen Past analgesics:  tramadol, Tylenol, Fioricet Past abortive triptans:  none Past abortive ergotamine:  none Past muscle relaxants:  none Past anti-emetic:  Zofran Past antihypertensive medications:  none Past antidepressant medications:  none Past anticonvulsant medications:  topiramate 25mg  at bedtime (effective but stopped due to pregnancy) Past anti-CGRP:  Nurtec (rescue - effective ) Past vitamins/Herbal/Supplements:  Migraine relieve supplement combination PRN Past antihistamines/decongestants:  none Other past therapies:  none     Family history of headache:  no  PAST MEDICAL HISTORY: Past Medical History:  Diagnosis Date   Migraines     MEDICATIONS: Current Outpatient Medications on File Prior to Visit  Medication Sig Dispense Refill   norethindrone (MICRONOR) 0.35 MG tablet Take 1 tablet by mouth once daily. 84 tablet 3   oxyCODONE (OXY IR/ROXICODONE) 5 MG immediate release tablet Take 1-2 tablets (5-10 mg total) by mouth every 4 (four) hours as needed for moderate pain. 30 tablet 0   Prenatal MV & Min w/FA-DHA (PRENATAL GUMMIES PO) Take 1 each by mouth daily.     No current facility-administered medications on file prior to visit.    ALLERGIES: No Known Allergies  FAMILY HISTORY: Family History  Problem Relation Age of Onset   Healthy Mother    Diabetes Father    Bone cancer Father    Healthy Sister    Healthy Brother    Diabetes Maternal Grandmother    Diabetes Maternal Grandfather    Diabetes Paternal Grandmother    Diabetes Paternal Grandfather       Objective:  Blood pressure (!) 137/93, pulse 96, height 5\' 7"  (1.702 m), weight 96 lb 3.2 oz (43.6 kg), SpO2 99 %, unknown if currently breastfeeding. General: No acute  distress.  Patient appears well-groomed.   Head:  Normocephalic/atraumatic Eyes:  Fundi examined but not visualized Neck: supple, no paraspinal tenderness, full range of motion Heart:  Regular rate and rhythm Neurological Exam: alert and oriented to person, place, and time.  Speech fluent and not dysarthric, language intact.  CN II-XII intact. Bulk and tone normal, muscle strength 5/5 throughout.  Sensation to light touch intact.  Deep tendon reflexes 2+ throughout.  Finger to nose testing intact.  Gait normal, Romberg negative.   , DO  CC: , NP

## 2022-10-19 ENCOUNTER — Ambulatory Visit: Payer: Medicaid Other | Admitting: Neurology

## 2022-10-19 ENCOUNTER — Telehealth: Payer: Self-pay | Admitting: Neurology

## 2022-10-19 ENCOUNTER — Encounter: Payer: Self-pay | Admitting: Neurology

## 2022-10-19 VITALS — BP 137/93 | HR 96 | Ht 67.0 in | Wt 96.2 lb

## 2022-10-19 DIAGNOSIS — G43109 Migraine with aura, not intractable, without status migrainosus: Secondary | ICD-10-CM

## 2022-10-19 DIAGNOSIS — G43409 Hemiplegic migraine, not intractable, without status migrainosus: Secondary | ICD-10-CM | POA: Diagnosis not present

## 2022-10-19 MED ORDER — TOPIRAMATE 25 MG PO TABS: 25.0000 mg | ORAL_TABLET | Freq: Every day | ORAL | 5 refills | Status: DC

## 2022-10-19 MED ORDER — NURTEC 75 MG PO TBDP: 75.0000 mg | ORAL_TABLET | Freq: Every day | ORAL | 11 refills | Status: DC | PRN

## 2022-10-19 MED ORDER — ONDANSETRON 4 MG PO TBDP: 4.0000 mg | ORAL_TABLET | Freq: Three times a day (TID) | ORAL | 5 refills | Status: DC | PRN

## 2022-10-19 NOTE — Patient Instructions (Signed)
Restart topiramate 25mg  at bedtime. Nurtec 1 daily as needed.  Zofran for nausea Would stop Junel Fe - progestin only birth control recommended for women with migraine with aura Follow up 4 to 5 months.

## 2022-10-19 NOTE — Telephone Encounter (Signed)
Pt tried to get her Nurtec from the pharmacy, but they told her it needs prior authorization.

## 2022-10-19 NOTE — Progress Notes (Signed)
Medication Samples have been provided to the patient.  Drug name: Nurteec       Strength: 75 mg        Qty: 3  LOT: [id number]  Exp.Date: 01/27/2025  Dosing instructions: as needed  The patient has been instructed regarding the correct time, dose, and frequency of taking this medication, including desired effects and most common side effects.   Leida Lauth 10:42 AM 10/19/2022

## 2022-10-20 ENCOUNTER — Other Ambulatory Visit (HOSPITAL_COMMUNITY): Payer: Self-pay

## 2022-10-20 ENCOUNTER — Telehealth: Payer: Self-pay

## 2022-10-20 NOTE — Telephone Encounter (Signed)
Pharmacy Patient Advocate Encounter   Received notification from CoverMyMeds that prior authorization for Nurtec 75MG  dispersible tablets is required/requested.   PA submitted on 10/20/2022 to (ins) [employer] via CoverMyMeds  Key [id number]  Status is pending   Received notification from CoverMyMeds that prior authorization for Nurtec 75MG  dispersible tablets is required/requested.  PA submitted on 10/20/2022 to (ins) [employer] via CoverMyMeds  Key: [id number]  Status is pending

## 2022-10-24 ENCOUNTER — Other Ambulatory Visit (HOSPITAL_COMMUNITY): Payer: Self-pay

## 2022-10-24 NOTE — Telephone Encounter (Signed)
Pharmacy Patient Advocate Encounter  Prior Authorization for Nurtec 75MG  dispersible tablets has been approved.    PA# PA Case ID:  Effective dates: 10/20/2022 through 10/20/2023

## 2022-12-27 ENCOUNTER — Ambulatory Visit: Payer: Self-pay | Admitting: Neurology

## 2023-03-15 ENCOUNTER — Ambulatory Visit: Payer: Medicaid Other | Admitting: Neurology

## 2024-01-28 ENCOUNTER — Encounter (HOSPITAL_COMMUNITY): Payer: Self-pay

## 2024-01-28 ENCOUNTER — Encounter (HOSPITAL_COMMUNITY): Payer: Self-pay | Admitting: Obstetrics and Gynecology

## 2024-01-28 NOTE — Progress Notes (Signed)
 Spoke w/ via phone for pre-op interview--- [employer]----  UPT per anesthesia. Surgeon orders requested 01/28/24.       Lab results------ COVID test -----patient states asymptomatic no test needed Arrive at -------1015 NPO after MN NO Solid Food.  Clear liquids from MN until---0915 Pre-Surgery Ensure or G2:  Med rec completed Medications to take morning of surgery -----NONE Diabetic medication -----  GLP1 agonist last dose: GLP1 instructions:  Patient instructed no nail polish to be worn day of surgery Patient instructed to bring photo id and insurance card day of surgery Patient aware to have Driver (ride ) / caregiver    for 24 hours after surgery - Husband Lindsay Copeland Patient Special Instructions ----- Shower with antibacterial soap. Pre-Op special Instructions -----  Patient verbalized understanding of instructions that were given at this phone interview. Patient denies chest pain, sob, fever, cough at the interview.

## 2024-02-04 ENCOUNTER — Other Ambulatory Visit: Payer: Self-pay | Admitting: Obstetrics and Gynecology

## 2024-02-04 DIAGNOSIS — Q5128 Other doubling of uterus, other specified: Secondary | ICD-10-CM

## 2024-02-05 ENCOUNTER — Other Ambulatory Visit: Payer: Self-pay

## 2024-02-05 ENCOUNTER — Encounter (HOSPITAL_COMMUNITY): Payer: Self-pay | Admitting: Obstetrics and Gynecology

## 2024-02-05 ENCOUNTER — Encounter (HOSPITAL_COMMUNITY)
Admission: RE | Disposition: A | Discharge status: Home / Self Care | Payer: Self-pay | Source: Home / Self Care | Attending: Obstetrics and Gynecology

## 2024-02-05 ENCOUNTER — Ambulatory Visit (HOSPITAL_COMMUNITY)
Admission: RE | Admit: 2024-02-05 | Discharge: 2024-02-05 | Disposition: A | Discharge status: Home / Self Care | Payer: Self-pay | Attending: Obstetrics and Gynecology | Admitting: Obstetrics and Gynecology

## 2024-02-05 ENCOUNTER — Ambulatory Visit (HOSPITAL_BASED_OUTPATIENT_CLINIC_OR_DEPARTMENT_OTHER): Admitting: Anesthesiology

## 2024-02-05 ENCOUNTER — Ambulatory Visit (HOSPITAL_COMMUNITY): Admitting: Anesthesiology

## 2024-02-05 DIAGNOSIS — R519 Headache, unspecified: Secondary | ICD-10-CM | POA: Insufficient documentation

## 2024-02-05 DIAGNOSIS — Z01818 Encounter for other preprocedural examination: Secondary | ICD-10-CM

## 2024-02-05 DIAGNOSIS — Q5128 Other doubling of uterus, other specified: Secondary | ICD-10-CM | POA: Insufficient documentation

## 2024-02-05 DIAGNOSIS — N96 Recurrent pregnancy loss: Secondary | ICD-10-CM | POA: Insufficient documentation

## 2024-02-05 DIAGNOSIS — N859 Noninflammatory disorder of uterus, unspecified: Secondary | ICD-10-CM

## 2024-02-05 DIAGNOSIS — D649 Anemia, unspecified: Secondary | ICD-10-CM | POA: Diagnosis not present

## 2024-02-05 HISTORY — PX: UTERINE SEPTUM RESECTION: SHX5386

## 2024-02-05 LAB — POCT PREGNANCY, URINE: Preg Test, Ur: NEGATIVE

## 2024-02-05 SURGERY — RESECTION, SEPTUM, UTERUS
Anesthesia: General | Site: Uterus

## 2024-02-05 MED ORDER — ESTRADIOL 2 MG PO TABS: 2.0000 mg | ORAL_TABLET | Freq: Every day | ORAL | 0 refills | Status: DC

## 2024-02-05 MED ORDER — MIDAZOLAM HCL 2 MG/2ML IJ SOLN
INTRAMUSCULAR | Status: AC
  Filled 2024-02-05: qty 2

## 2024-02-05 MED ORDER — LIDOCAINE 2% (20 MG/ML) 5 ML SYRINGE
INTRAMUSCULAR | Status: DC | PRN
  Administered 2024-02-05: 40 mg via INTRAVENOUS

## 2024-02-05 MED ORDER — LIDOCAINE HCL 1 % IJ SOLN
INTRAMUSCULAR | Status: AC
  Filled 2024-02-05: qty 20

## 2024-02-05 MED ORDER — GLYCOPYRROLATE PF 0.2 MG/ML IJ SOSY
PREFILLED_SYRINGE | INTRAMUSCULAR | Status: AC
  Filled 2024-02-05: qty 1

## 2024-02-05 MED ORDER — DROPERIDOL 2.5 MG/ML IJ SOLN: 0.6250 mg | Freq: Once | INTRAMUSCULAR | Status: DC | PRN

## 2024-02-05 MED ORDER — KETOROLAC TROMETHAMINE 30 MG/ML IJ SOLN
INTRAMUSCULAR | Status: DC | PRN
  Administered 2024-02-05: 30 mg via INTRAVENOUS

## 2024-02-05 MED ORDER — SODIUM CHLORIDE 0.9 % IR SOLN
Status: DC | PRN
  Administered 2024-02-05 (×2): 3000 mL

## 2024-02-05 MED ORDER — CHLORHEXIDINE GLUCONATE 0.12 % MT SOLN
OROMUCOSAL | Status: AC
  Filled 2024-02-05: qty 15

## 2024-02-05 MED ORDER — LACTATED RINGERS IV SOLN: INTRAVENOUS | Status: DC

## 2024-02-05 MED ORDER — PROPOFOL 10 MG/ML IV BOLUS
INTRAVENOUS | Status: DC | PRN
  Administered 2024-02-05: 150 mg via INTRAVENOUS

## 2024-02-05 MED ORDER — LIDOCAINE 2% (20 MG/ML) 5 ML SYRINGE
INTRAMUSCULAR | Status: AC
  Filled 2024-02-05: qty 5

## 2024-02-05 MED ORDER — FENTANYL CITRATE (PF) 250 MCG/5ML IJ SOLN
INTRAMUSCULAR | Status: AC
  Filled 2024-02-05: qty 5

## 2024-02-05 MED ORDER — DEXAMETHASONE SODIUM PHOSPHATE 10 MG/ML IJ SOLN
INTRAMUSCULAR | Status: DC | PRN
  Administered 2024-02-05: 10 mg via INTRAVENOUS

## 2024-02-05 MED ORDER — MEDROXYPROGESTERONE ACETATE 10 MG PO TABS: 10.0000 mg | ORAL_TABLET | Freq: Every day | ORAL | 0 refills | Status: DC

## 2024-02-05 MED ORDER — PHENYLEPHRINE 80 MCG/ML (10ML) SYRINGE FOR IV PUSH (FOR BLOOD PRESSURE SUPPORT)
PREFILLED_SYRINGE | INTRAVENOUS | Status: DC | PRN
  Administered 2024-02-05: 160 ug via INTRAVENOUS

## 2024-02-05 MED ORDER — LIDOCAINE HCL 1 % IJ SOLN
INTRAMUSCULAR | Status: DC | PRN
  Administered 2024-02-05: 10 mL

## 2024-02-05 MED ORDER — SCOPOLAMINE 1 MG/3DAYS TD PT72SCOPOLAMINE 1 MG/3DAYS
MEDICATED_PATCH | TRANSDERMAL | Status: DC | PRN
Start: 2024-02-05 — End: 2024-02-05
  Administered 2024-02-05: 1 via TRANSDERMAL

## 2024-02-05 MED ORDER — KETOROLAC TROMETHAMINE 30 MG/ML IJ SOLN
INTRAMUSCULAR | Status: AC
  Filled 2024-02-05: qty 1

## 2024-02-05 MED ORDER — ORAL CARE MOUTH RINSE: 15.0000 mL | Freq: Once | OROMUCOSAL | Status: AC

## 2024-02-05 MED ORDER — FENTANYL CITRATE (PF) 100 MCG/2ML IJ SOLN: 25.0000 ug | INTRAMUSCULAR | Status: DC | PRN

## 2024-02-05 MED ORDER — MIDAZOLAM HCL 5 MG/5ML IJ SOLN
INTRAMUSCULAR | Status: DC | PRN
  Administered 2024-02-05: 2 mg via INTRAVENOUS

## 2024-02-05 MED ORDER — IBUPROFEN 600 MG PO TABS: 600.0000 mg | ORAL_TABLET | Freq: Four times a day (QID) | ORAL | 0 refills | Status: DC | PRN

## 2024-02-05 MED ORDER — ONDANSETRON HCL 4 MG/2ML IJ SOLN
INTRAMUSCULAR | Status: DC | PRN
  Administered 2024-02-05: 4 mg via INTRAVENOUS

## 2024-02-05 MED ORDER — DEXMEDETOMIDINE HCL IN NACL 80 MCG/20ML IV SOLN
INTRAVENOUS | Status: DC | PRN
  Administered 2024-02-05: 4 ug via INTRAVENOUS

## 2024-02-05 MED ORDER — ROCURONIUM BROMIDE 10 MG/ML (PF) SYRINGE
PREFILLED_SYRINGE | INTRAVENOUS | Status: AC
  Filled 2024-02-05: qty 10

## 2024-02-05 MED ORDER — ONDANSETRON HCL 4 MG/2ML IJ SOLN
INTRAMUSCULAR | Status: AC
  Filled 2024-02-05: qty 2

## 2024-02-05 MED ORDER — LEVONORGESTREL 20 MCG/DAY IU IUD
1.0000 | INTRAUTERINE_SYSTEM | INTRAUTERINE | Status: DC
  Filled 2024-02-05: qty 1

## 2024-02-05 MED ORDER — GLYCOPYRROLATE PF 0.2 MG/ML IJ SOSY
PREFILLED_SYRINGE | INTRAMUSCULAR | Status: DC | PRN
  Administered 2024-02-05: .2 mg via INTRAVENOUS

## 2024-02-05 MED ORDER — FENTANYL CITRATE (PF) 100 MCG/2ML IJ SOLN
INTRAMUSCULAR | Status: DC | PRN
  Administered 2024-02-05: 25 ug via INTRAVENOUS
  Administered 2024-02-05: 50 ug via INTRAVENOUS

## 2024-02-05 MED ORDER — OXYCODONE HCL 5 MG PO TABS: 5.0000 mg | ORAL_TABLET | Freq: Once | ORAL | Status: DC | PRN

## 2024-02-05 MED ORDER — CHLORHEXIDINE GLUCONATE 0.12 % MT SOLN
15.0000 mL | Freq: Once | OROMUCOSAL | Status: AC
Start: 2024-02-05 — End: 2024-02-05
  Administered 2024-02-05: 15 mL via OROMUCOSAL

## 2024-02-05 MED ORDER — DEXAMETHASONE SODIUM PHOSPHATE 10 MG/ML IJ SOLN
INTRAMUSCULAR | Status: AC
  Filled 2024-02-05: qty 1

## 2024-02-05 MED ORDER — PHENYLEPHRINE 80 MCG/ML (10ML) SYRINGE FOR IV PUSH (FOR BLOOD PRESSURE SUPPORT)
PREFILLED_SYRINGE | INTRAVENOUS | Status: AC
  Filled 2024-02-05: qty 10

## 2024-02-05 MED ORDER — EPHEDRINE SULFATE-NACL 50-0.9 MG/10ML-% IV SOSY
PREFILLED_SYRINGE | INTRAVENOUS | Status: DC | PRN
Start: 2024-02-05 — End: 2024-02-05
  Administered 2024-02-05: 10 mg via INTRAVENOUS

## 2024-02-05 MED ORDER — OXYCODONE HCL 5 MG/5ML PO SOLN: 5.0000 mg | Freq: Once | ORAL | Status: DC | PRN

## 2024-02-05 MED ORDER — PROPOFOL 10 MG/ML IV BOLUS
INTRAVENOUS | Status: AC
  Filled 2024-02-05: qty 20

## 2024-02-05 MED ORDER — SCOPOLAMINE 1 MG/3DAYS TD PT72
MEDICATED_PATCH | TRANSDERMAL | Status: AC
Start: 2024-02-05 — End: 2024-02-05
  Filled 2024-02-05: qty 1

## 2024-02-05 MED ORDER — EPHEDRINE 5 MG/ML INJ
INTRAVENOUS | Status: AC
  Filled 2024-02-05: qty 5

## 2024-02-05 MED ORDER — ACETAMINOPHEN 10 MG/ML IV SOLN: 1000.0000 mg | Freq: Once | INTRAVENOUS | Status: DC | PRN

## 2024-02-05 MED ORDER — POVIDONE-IODINE 10 % EX SWAB: 2.0000 | Freq: Once | CUTANEOUS | Status: DC

## 2024-02-05 SURGICAL SUPPLY — 19 items
ABLATOR SURESOUND NOVASURE (ABLATOR) IMPLANT
CANISTER SUCT 3000ML PPV (MISCELLANEOUS) ×2 IMPLANT
CATH ROBINSON RED A/P 16FR (CATHETERS) ×2 IMPLANT
DEVICE MYOSURE LITE (MISCELLANEOUS) IMPLANT
DEVICE MYOSURE REACH (MISCELLANEOUS) IMPLANT
DILATOR CANAL MILEX (MISCELLANEOUS) IMPLANT
GLOVE BIO SURGEON STRL SZ7 (GLOVE) ×2 IMPLANT
GLOVE BIOGEL PI IND STRL 7.0 (GLOVE) IMPLANT
GLOVE SURG UNDER POLY LF SZ7 (GLOVE) ×2 IMPLANT
GOWN STRL REUS W/ TWL LRG LVL3 (GOWN DISPOSABLE) ×4 IMPLANT
KIT PROCEDURE FLUENT (KITS) ×2 IMPLANT
KIT TURNOVER KIT B (KITS) ×2 IMPLANT
MIRENA IUD IMPLANT
PACK VAGINAL MINOR WOMEN LF (CUSTOM PROCEDURE TRAY) ×2 IMPLANT
PAD OB MATERNITY 11 LF (PERSONAL CARE ITEMS) ×2 IMPLANT
SEAL ROD LENS SCOPE MYOSURE (ABLATOR) ×2 IMPLANT
SOL .9 NS 3000ML IRR UROMATIC (IV SOLUTION) IMPLANT
TOWEL GREEN STERILE FF (TOWEL DISPOSABLE) ×4 IMPLANT
UNDERPAD 30X36 HEAVY ABSORB (UNDERPADS AND DIAPERS) ×2 IMPLANT

## 2024-02-05 NOTE — Anesthesia Procedure Notes (Signed)
 Procedure Name: LMA Insertion Date/Time: 02/05/2024 12:24 PM  Performed by: Bishop Limbo, CRNAPre-anesthesia Checklist: Patient identified, Emergency Drugs available, Suction available and Patient being monitored Patient Re-evaluated:Patient Re-evaluated prior to induction Oxygen Delivery Method: Circle System Utilized Preoxygenation: Pre-oxygenation with 100% oxygen Induction Type: IV induction Ventilation: Mask ventilation without difficulty LMA: LMA inserted LMA Size: 3.0 Number of attempts: 1 Placement Confirmation: positive ETCO2 Tube secured with: Tape Dental Injury: Teeth and Oropharynx as per pre-operative assessment

## 2024-02-05 NOTE — Anesthesia Preprocedure Evaluation (Signed)
 Anesthesia Evaluation  Patient identified by MRN, date of birth, ID band Patient awake    Reviewed: Allergy & Precautions, NPO status , Patient's Chart, lab work & pertinent test results  Airway Mallampati: I  TM Distance: >3 FB Neck ROM: Full    Dental  (+) Teeth Intact, Dental Advisory Given   Pulmonary neg pulmonary ROS   breath sounds clear to auscultation       Cardiovascular negative cardio ROS  Rhythm:Regular Rate:Normal     Neuro/Psych  Headaches  negative psych ROS   GI/Hepatic negative GI ROS, Neg liver ROS,,,  Endo/Other  negative endocrine ROS    Renal/GU negative Renal ROS     Musculoskeletal negative musculoskeletal ROS (+)    Abdominal   Peds  Hematology  (+) Blood dyscrasia, anemia   Anesthesia Other Findings   Reproductive/Obstetrics                             Anesthesia Physical Anesthesia Plan  ASA: 2  Anesthesia Plan: General   Post-op Pain Management: Tylenol PO (pre-op)* and Toradol IV (intra-op)*   Induction: Intravenous  PONV Risk Score and Plan: 4 or greater and Ondansetron, Dexamethasone, Midazolam and Scopolamine patch - Pre-op  Airway Management Planned: LMA  Additional Equipment: None  Intra-op Plan:   Post-operative Plan: Extubation in OR  Informed Consent: I have reviewed the patients History and Physical, chart, labs and discussed the procedure including the risks, benefits and alternatives for the proposed anesthesia with the patient or authorized representative who has indicated his/her understanding and acceptance.       Plan Discussed with: CRNA  Anesthesia Plan Comments:        Anesthesia Quick Evaluation

## 2024-02-05 NOTE — Op Note (Signed)
 Pre-Operative Diagnosis: 1) recurrent pregnancy loss 2) residual uterine septum Postoperative Diagnosis: 1) recurrent pregnancy loss 2) residual uterine septum Procedure: Hysteroscopic resection of uterine septum Surgeon: Dr. Waynard Reeds Assistant: None Operative Findings: Bilateral tubal ostia were visualized.  Midline uterine septum visualized.  No evidence of intrauterine adhesive disease. Specimen: Endometrial curettings EBL: Minimal  Lindsay Copeland Is a 28 year old who presents for surgical management for recurrent pregnancy loss and residual uterine septum. Please see the patient's history and physical for complete details of the history. Management options were discussed with the patient. R/B/A reviewed. Following appropriate informed consent was taken to the operating room. The patient was appropriately identified during a time out procedure.  General anesthesia was administered and the patient was placed in the dorsal lithotomy position. The patient was prepped and draped in the normal sterile fashion. A speculum was placed into the vagina, a single-tooth tenaculum was placed on the anterior lip of the cervix, and 10 cc of 1% lidocaine was administered in a paracervical fashion. The cervix was dilated using a #13 Pratt dilator.  The hysteroscope was introduced for the above findings.  Hysteroscopic scissors were used to resect the uterine septum to the myometrium at the fundus of the endometrial cavity.  The tubal ostia were visualized during the entire resection portion of the case.  The MyoSure reach was then used to resect residual endometrial tissue however maintaining visualization of the endometrial cavity was difficult at this portion of the case and therefore the MyoSure reach was discontinued.  A gentle sharp curettage was performed.  The hysteroscope was then reintroduced for final visualization of the endometrial cavity.  No significant residual septum was visualized.  This completed the  case.  All sponge, needle, instrument counts were correct.  Patient was taken to the PACU in stable condition following the procedure.  Fluid deficit 470 cc.

## 2024-02-05 NOTE — Transfer of Care (Signed)
 Immediate Anesthesia Transfer of Care Note  Patient: Lindsay Copeland  Procedure(s) Performed: RESECTION, SEPTUM, UTERUS (Uterus)  Patient Location: PACU  Anesthesia Type:General  Level of Consciousness: awake, alert , oriented, and patient cooperative  Airway & Oxygen Therapy: Patient Spontanous Breathing  Post-op Assessment: Report given to RN, Pt stable.  Post vital signs: Reviewed and stable  Last Vitals:  Vitals Value Taken Time  BP 131/83 02/05/24 1309  Temp 36.6 C 02/05/24 1309  Pulse 96 02/05/24 1312  Resp 10 02/05/24 1312  SpO2 99 % 02/05/24 1312  Vitals shown include unfiled device data.  Last Pain:  Vitals:   02/05/24 1046  TempSrc:   PainSc: 0-No pain      Patients Stated Pain Goal: 4 (02/05/24 1046)  Complications: No notable events documented.

## 2024-02-05 NOTE — Anesthesia Postprocedure Evaluation (Signed)
 Anesthesia Post Note  Patient: Lindsay Copeland  Procedure(s) Performed: RESECTION, SEPTUM, UTERUS (Uterus)     Patient location during evaluation: PACU Anesthesia Type: General Level of consciousness: awake and alert Pain management: pain level controlled Vital Signs Assessment: post-procedure vital signs reviewed and stable Respiratory status: spontaneous breathing, nonlabored ventilation, respiratory function stable and patient connected to nasal cannula oxygen Cardiovascular status: blood pressure returned to baseline and stable Postop Assessment: no apparent nausea or vomiting Anesthetic complications: no  No notable events documented.  Last Vitals:  Vitals:   02/05/24 1345 02/05/24 1400  BP: 114/65   Pulse: 72 75  Resp: 10 16  Temp:    SpO2: 99% (!) 88%    Last Pain:  Vitals:   02/05/24 1345  TempSrc:   PainSc: 1                  Shelton Silvas

## 2024-02-05 NOTE — H&P (Signed)
 Lindsay Copeland is an 28 y.o. female.  28 year old G3 P1-0-2-1 presents for hysteroscopic resection of residual uterine septum versus lysis of adhesions.  Patient delivered via cesarean section for breech presentation in September 2022.  During her cesarean delivery a uterine septum was palpated.  Post partum she underwent sonohysterogram which demonstrated a uterine septum and was referred to [facility] for further management.  She underwent a hysteroscopic resection of partial uterine septum in September 2023.  Since that time she has experienced 2 consecutive early miscarriages.  As part of the evaluation for recurrent pregnancy loss a repeat sonohysterogram was performed which demonstrated residual uterine septum versus intrauterine adhesions.  Management options were discussed with the patient and she wishes to proceed with repeat hysteroscopy with resection of residual septum versus lysis of adhesions.  Risks/benefits/alternatives reviewed and she wishes to proceed    Patient's last menstrual period was 01/27/2024 (exact date).    Past Medical History:  Diagnosis Date   Migraines     Past Surgical History:  Procedure Laterality Date   CESAREAN SECTION N/A 07/03/2021   Procedure: CESAREAN SECTION;  Surgeon: Edwinna Areola, DO;  Location: [facility];  Service: Obstetrics;  Laterality: N/A;  Primary C/S Breech   WISDOM TOOTH EXTRACTION      Family History  Problem Relation Age of Onset   Healthy Mother    Diabetes Father    Bone cancer Father    Healthy Sister    Healthy Brother    Diabetes Maternal Grandmother    Diabetes Maternal Grandfather    Diabetes Paternal Grandmother    Diabetes Paternal Grandfather     Social History:  reports that she has never smoked. She has never used smokeless tobacco. She reports that she does not drink alcohol and does not use drugs.  Allergies: No Known Allergies  No medications prior to admission.    Review of Systems  Blood  pressure (!) 139/93, pulse 87, temperature 98.4 F (36.9 C), temperature source Oral, resp. rate 16, height 5\' 7"  (1.702 m), weight 45.4 kg, last menstrual period 01/27/2024, SpO2 98%, not currently breastfeeding. Physical Exam AOx3, NAD Normal work Of breathing Abd soft  Results for orders placed or performed during the hospital encounter of 02/05/24 (from the past 24 hours)  Pregnancy, urine POC     Status: None   Collection Time: 02/05/24 10:36 AM  Result Value Ref Range   Preg Test, Ur NEGATIVE NEGATIVE    No results found.  Assessment/Plan: 1) admit to short stay  2) SCDs for DVT prophylaxis 3) proceed with hysteroscopy with resection of residual uterine septum versus lysis of adhesion.  Discussed with the patient placement of a progestin intrauterine device following resection of an septum.  The data on the benefits of IUD placement for prevention of intrauterine adhesions is mixed.  Some suggest no difference in outcomes development of postop adhesions versus other studies indicating benefit of IUD and estrogen.  Estrogen therapy alone was used following her previous surgical intervention therefore we will try progestin IUD and estrogen postoperatively at this time.  Waynard Reeds 02/05/2024, 11:34 AM

## 2024-02-05 NOTE — Discharge Instructions (Signed)

## 2024-02-06 ENCOUNTER — Encounter (HOSPITAL_COMMUNITY): Payer: Self-pay | Admitting: Obstetrics and Gynecology

## 2024-02-06 LAB — SURGICAL PATHOLOGY

## 2024-05-05 DIAGNOSIS — O209 Hemorrhage in early pregnancy, unspecified: Secondary | ICD-10-CM | POA: Diagnosis not present

## 2024-05-07 DIAGNOSIS — O209 Hemorrhage in early pregnancy, unspecified: Secondary | ICD-10-CM | POA: Diagnosis not present

## 2024-05-09 DIAGNOSIS — Q51818 Other congenital malformations of uterus: Secondary | ICD-10-CM | POA: Diagnosis not present

## 2024-05-09 DIAGNOSIS — Z3201 Encounter for pregnancy test, result positive: Secondary | ICD-10-CM | POA: Diagnosis not present

## 2024-05-09 DIAGNOSIS — O209 Hemorrhage in early pregnancy, unspecified: Secondary | ICD-10-CM | POA: Diagnosis not present

## 2024-05-09 DIAGNOSIS — N926 Irregular menstruation, unspecified: Secondary | ICD-10-CM | POA: Diagnosis not present

## 2024-05-16 ENCOUNTER — Encounter: Payer: Self-pay | Admitting: Advanced Practice Midwife

## 2024-05-21 DIAGNOSIS — O3680X0 Pregnancy with inconclusive fetal viability, not applicable or unspecified: Secondary | ICD-10-CM | POA: Diagnosis not present

## 2024-05-21 DIAGNOSIS — Z3480 Encounter for supervision of other normal pregnancy, unspecified trimester: Secondary | ICD-10-CM | POA: Diagnosis not present

## 2024-05-21 DIAGNOSIS — Z113 Encounter for screening for infections with a predominantly sexual mode of transmission: Secondary | ICD-10-CM | POA: Diagnosis not present

## 2024-05-26 DIAGNOSIS — Z3A08 8 weeks gestation of pregnancy: Secondary | ICD-10-CM | POA: Diagnosis not present

## 2024-05-26 DIAGNOSIS — O3680X0 Pregnancy with inconclusive fetal viability, not applicable or unspecified: Secondary | ICD-10-CM | POA: Diagnosis not present

## 2024-06-09 LAB — HEPATITIS C ANTIBODY: HCV Ab: NEGATIVE

## 2024-06-09 LAB — OB RESULTS CONSOLE GC/CHLAMYDIA
Chlamydia: NEGATIVE
Neisseria Gonorrhea: NEGATIVE

## 2024-06-09 LAB — OB RESULTS CONSOLE HIV ANTIBODY (ROUTINE TESTING): HIV: NONREACTIVE

## 2024-06-09 LAB — OB RESULTS CONSOLE RPR: RPR: NONREACTIVE

## 2024-06-09 LAB — OB RESULTS CONSOLE RUBELLA ANTIBODY, IGM: Rubella: IMMUNE

## 2024-06-09 LAB — OB RESULTS CONSOLE HEPATITIS B SURFACE ANTIGEN: Hepatitis B Surface Ag: NEGATIVE

## 2024-10-30 NOTE — L&D Delivery Note (Signed)
 Delivery Note At 11:46 AM a viable female was delivered via VBAC, Spontaneous (Presentation:  Right Occiput Anterior).  APGAR: 9, 10; weight pending .   Placenta status: Spontaneous;Pathology, Intact.  Cord: 3 vessels with nuchal cord x 1 reduced on the perineum  Patient pushed for approximately an hour and delivered a vigorous female infant in the vertex right occiput anterior compound presentation right hand. While the infant's head was crowning it was apparent that a episiotomy would be required to allow for delivery of the infant's head.  A right mediolateral was cut and the infant's head delivered with the next contraction.  A loose nuchal cord was identified and reduced on the perineum.  The infant's body was then delivered and the infant was passed to the waiting maternal abdomen and cried vigorously.  Prior to delivery bloody show was greater than would be expected for cervical change and partial placental abruption was suspected.  With delivery of the infant old clot was noted consistent with abruption.  Following a 1 minute delay the umbilical cord was clamped and cut.  The placenta was then delivered spontaneously, intact, three-vessel cord.  Evidence of prior subchorionic hemorrhage from the beginning of the pregnancy was noted along the edge of the placenta.  Additionally acute partial abruption was noted at the same edge of for approximately 10%.  Right mediolateral episiotomy was repaired with 3-0 Vicryl Rapide.  All sponge, needle, instrument counts were correct.  Mom and baby are doing well following delivery.  Parents desire circumcision.   Anesthesia: Epidural Episiotomy: Right medial lateral episiotomy Lacerations: 2nd degree Suture Repair: 3.0 vicryl rapide Est. Blood Loss (mL):  455 cc  Mom to postpartum.  Baby to Couplet care / Skin to Skin.  Marjorie Gull 11/25/2024, 12:30 PM

## 2024-11-20 ENCOUNTER — Other Ambulatory Visit: Payer: Self-pay

## 2024-11-20 ENCOUNTER — Observation Stay (HOSPITAL_COMMUNITY)
Admission: AD | Admit: 2024-11-20 | Discharge: 2024-11-21 | Disposition: A | Discharge status: Home / Self Care | Attending: Obstetrics and Gynecology | Admitting: Obstetrics and Gynecology

## 2024-11-20 ENCOUNTER — Encounter (HOSPITAL_COMMUNITY): Payer: Self-pay | Admitting: Obstetrics and Gynecology

## 2024-11-20 ENCOUNTER — Inpatient Hospital Stay (HOSPITAL_COMMUNITY)

## 2024-11-20 DIAGNOSIS — Z3A34 34 weeks gestation of pregnancy: Secondary | ICD-10-CM

## 2024-11-20 DIAGNOSIS — O4690 Antepartum hemorrhage, unspecified, unspecified trimester: Principal | ICD-10-CM | POA: Insufficient documentation

## 2024-11-20 DIAGNOSIS — O4593 Premature separation of placenta, unspecified, third trimester: Secondary | ICD-10-CM | POA: Diagnosis not present

## 2024-11-20 DIAGNOSIS — O34219 Maternal care for unspecified type scar from previous cesarean delivery: Secondary | ICD-10-CM

## 2024-11-20 LAB — CBC
HCT: 33.9 % — ABNORMAL LOW (ref 36.0–46.0)
Hemoglobin: 11.1 g/dL — ABNORMAL LOW (ref 12.0–15.0)
MCH: 28.1 pg (ref 26.0–34.0)
MCHC: 32.7 g/dL (ref 30.0–36.0)
MCV: 85.8 fL (ref 80.0–100.0)
Platelets: 247 K/uL (ref 150–400)
RBC: 3.95 MIL/uL (ref 3.87–5.11)
RDW: 12.8 % (ref 11.5–15.5)
WBC: 8.9 K/uL (ref 4.0–10.5)
nRBC: 0 % (ref 0.0–0.2)

## 2024-11-20 LAB — URINALYSIS, ROUTINE W REFLEX MICROSCOPIC
Bilirubin Urine: NEGATIVE
Glucose, UA: NEGATIVE mg/dL
Ketones, ur: NEGATIVE mg/dL
Leukocytes,Ua: NEGATIVE
Nitrite: NEGATIVE
Protein, ur: NEGATIVE mg/dL
Specific Gravity, Urine: 1.002 — ABNORMAL LOW (ref 1.005–1.030)
pH: 7 (ref 5.0–8.0)

## 2024-11-20 LAB — COMPREHENSIVE METABOLIC PANEL WITH GFR
ALT: 13 U/L (ref 0–44)
AST: 23 U/L (ref 15–41)
Albumin: 3.4 g/dL — ABNORMAL LOW (ref 3.5–5.0)
Alkaline Phosphatase: 134 U/L — ABNORMAL HIGH (ref 38–126)
Anion gap: 11 (ref 5–15)
BUN: 7 mg/dL (ref 6–20)
CO2: 20 mmol/L — ABNORMAL LOW (ref 22–32)
Calcium: 8.6 mg/dL — ABNORMAL LOW (ref 8.9–10.3)
Chloride: 105 mmol/L (ref 98–111)
Creatinine, Ser: 0.56 mg/dL (ref 0.44–1.00)
GFR, Estimated: >60 mL/min
Glucose, Bld: 89 mg/dL (ref 70–99)
Potassium: 3.6 mmol/L (ref 3.5–5.1)
Sodium: 136 mmol/L (ref 135–145)
Total Bilirubin: 0.3 mg/dL (ref 0.0–1.2)
Total Protein: 6.3 g/dL — ABNORMAL LOW (ref 6.5–8.1)

## 2024-11-20 LAB — TYPE AND SCREEN
ABO/RH(D): A POS
Antibody Screen: NEGATIVE

## 2024-11-20 LAB — PROTEIN / CREATININE RATIO, URINE
Creatinine, Urine: 18 mg/dL
Total Protein, Urine: <6 mg/dL

## 2024-11-20 MED ORDER — DOCUSATE SODIUM 100 MG PO CAPS
100.0000 mg | ORAL_CAPSULE | Freq: Every day | ORAL | Status: DC
  Filled 2024-11-20: qty 1

## 2024-11-20 MED ORDER — PRENATAL MULTIVITAMIN CH
1.0000 | ORAL_TABLET | Freq: Every day | ORAL | Status: DC
  Administered 2024-11-20: 1 via ORAL
  Filled 2024-11-20: qty 1

## 2024-11-20 MED ORDER — ACETAMINOPHEN 325 MG PO TABS: 650.0000 mg | ORAL_TABLET | ORAL | Status: DC | PRN

## 2024-11-20 MED ORDER — SODIUM CHLORIDE 0.9 % IV SOLN: 250.0000 mL | INTRAVENOUS | Status: AC | PRN

## 2024-11-20 MED ORDER — SODIUM CHLORIDE 0.9% FLUSH
3.0000 mL | Freq: Two times a day (BID) | INTRAVENOUS | Status: DC
  Administered 2024-11-20 (×2): 3 mL via INTRAVENOUS

## 2024-11-20 MED ORDER — LACTATED RINGERS IV SOLN: 125.0000 mL/h | INTRAVENOUS | Status: AC

## 2024-11-20 MED ORDER — CALCIUM CARBONATE ANTACID 500 MG PO CHEW: 2.0000 | CHEWABLE_TABLET | ORAL | Status: DC | PRN

## 2024-11-20 MED ORDER — SODIUM CHLORIDE 0.9% FLUSH: 3.0000 mL | INTRAVENOUS | Status: DC | PRN

## 2024-11-20 NOTE — H&P (Signed)
 Lindsay Copeland is a 29 y.o. female presenting for vaginal bleeding  29 year-old G5 P1 031 @34 +2 presents for vaginal bleeding. Patient woke up and went to the bathroom and noted spontaneous vaginal bleeding. She denies any recent intercourse or abdominal trauma. And maternity of missions, bleeding heavier than postcoital bleeding was noted on exam. No active bleeding was noted. Ultrasound did not show any obvious placental abruption. Patient did have a subchorionic hemorrhage early in pregnancy which spontaneous resolved. She said no additional vaginal bleeding until this episode.   Pregnancy problems:  1) history of C/S Delivery for breech: Uterine septum identified at time of C-section. Postpartum, patient underwent uterine septum resection.SABRA 2) Recurrent miscarriage secondary to residual uterine septum. Following uterine septum resection patient had three subsequent miscarriages. Evaluation showed residual uterine septum. Patient underwent a second hysteroscopic resection. OB History     Gravida  5   Para  1   Term  1   Preterm      AB  3   Living  1      SAB  3   IAB      Ectopic      Multiple  0   Live Births  1          Past Medical History:  Diagnosis Date   Migraines    Past Surgical History:  Procedure Laterality Date   CESAREAN SECTION N/A 07/03/2021   Procedure: CESAREAN SECTION;  Surgeon: Delana Ted Morrison, DO;  Location: [facility];  Service: Obstetrics;  Laterality: N/A;  Primary C/S Breech   UTERINE SEPTUM RESECTION N/A 02/05/2024   Procedure: RESECTION, SEPTUM, UTERUS;  Surgeon: Okey Leader, MD;  Location: [facility] OR;  Service: Gynecology;  Laterality: N/A;   WISDOM TOOTH EXTRACTION     Family History: family history includes Bone cancer in her father; Diabetes in her father, maternal grandfather, maternal grandmother, paternal grandfather, and paternal grandmother; Healthy in her brother, mother, and sister. Social History:  reports that she has  never smoked. She has never used smokeless tobacco. She reports that she does not drink alcohol and does not use drugs.     Maternal Diabetes: No Genetic Screening: Normal Maternal Ultrasounds/Referrals: Normal Fetal Ultrasounds or other Referrals:  None Maternal Substance Abuse:  No Significant Maternal Medications:  None Significant Maternal Lab Results:  None Number of Prenatal Visits:greater than 3 verified prenatal visits Maternal Vaccinations: Other Comments:  None  Review of Systems History   Blood pressure (!) 146/97, pulse (!) 101, temperature 98.1 F (36.7 C), temperature source Oral, resp. rate 16, height 5' 7 (1.702 m), weight 54.1 kg, SpO2 100%. Exam Physical Exam  AOx3 Gravid, soft FHR 140 reactive, cat 1 tracing Prenatal labs: ABO, Rh: --/--/A POS (01/22 0545) Antibody: NEG (01/22 0545) Rubella:  imm RPR:   NR HBsAg:   Neg HIV:   NR GBS:   too early Results for orders placed or performed during the hospital encounter of 11/20/24 (from the past 24 hours)  Urinalysis, Routine w reflex microscopic -Urine, Clean Catch     Status: Abnormal   Collection Time: 11/20/24  3:39 AM  Result Value Ref Range   Color, Urine STRAW (A) YELLOW   APPearance CLEAR CLEAR   Specific Gravity, Urine 1.002 (L) 1.005 - 1.030   pH 7.0 5.0 - 8.0   Glucose, UA NEGATIVE NEGATIVE mg/dL   Hgb urine dipstick LARGE (A) NEGATIVE   Bilirubin Urine NEGATIVE NEGATIVE   Ketones, ur NEGATIVE NEGATIVE mg/dL  Protein, ur NEGATIVE NEGATIVE mg/dL   Nitrite NEGATIVE NEGATIVE   Leukocytes,Ua NEGATIVE NEGATIVE   RBC / HPF 0-5 0 - 5 RBC/hpf   WBC, UA 0-5 0 - 5 WBC/hpf   Bacteria, UA RARE (A) NONE SEEN   Squamous Epithelial / HPF 0-5 0 - 5 /HPF   Mucus PRESENT   Protein / creatinine ratio, urine     Status: None   Collection Time: 11/20/24  3:39 AM  Result Value Ref Range   Creatinine, Urine 18 mg/dL   Total Protein, Urine <6 mg/dL   Protein Creatinine Ratio NOT CALCULATED <0.2 mg/mg  CBC      Status: Abnormal   Collection Time: 11/20/24  4:28 AM  Result Value Ref Range   WBC 8.9 4.0 - 10.5 K/uL   RBC 3.95 3.87 - 5.11 MIL/uL   Hemoglobin 11.1 (L) 12.0 - 15.0 g/dL   HCT 66.0 (L) 63.9 - 53.9 %   MCV 85.8 80.0 - 100.0 fL   MCH 28.1 26.0 - 34.0 pg   MCHC 32.7 30.0 - 36.0 g/dL   RDW 87.1 88.4 - 84.4 %   Platelets 247 150 - 400 K/uL   nRBC 0.0 0.0 - 0.2 %  Comprehensive metabolic panel with GFR     Status: Abnormal   Collection Time: 11/20/24  5:09 AM  Result Value Ref Range   Sodium 136 135 - 145 mmol/L   Potassium 3.6 3.5 - 5.1 mmol/L   Chloride 105 98 - 111 mmol/L   CO2 20 (L) 22 - 32 mmol/L   Glucose, Bld 89 70 - 99 mg/dL   BUN 7 6 - 20 mg/dL   Creatinine, Ser 9.43 0.44 - 1.00 mg/dL   Calcium  8.6 (L) 8.9 - 10.3 mg/dL   Total Protein 6.3 (L) 6.5 - 8.1 g/dL   Albumin 3.4 (L) 3.5 - 5.0 g/dL   AST 23 15 - 41 U/L   ALT 13 0 - 44 U/L   Alkaline Phosphatase 134 (H) 38 - 126 U/L   Total Bilirubin 0.3 0.0 - 1.2 mg/dL   GFR, Estimated >39 >39 mL/min   Anion gap 11 5 - 15  Type and screen [facility]     Status: None   Collection Time: 11/20/24  5:45 AM  Result Value Ref Range   ABO/RH(D) A POS    Antibody Screen NEG    Sample Expiration      11/23/2024,2359 Performed at [facility], [address] [place], [place] [address]      Assessment/Plan: 1) Add it to antepartum for 23 hour obs 2) Continuous monitoring.  3) Pad count 4) Mild range blood pressure is noted in maternity admissions. PIH labs negative.  Marjorie Gull 11/20/2024, 7:21 AM

## 2024-11-20 NOTE — MAU Provider Note (Signed)
 Chief Complaint:  Vaginal Bleeding   HPI   None     Lindsay Copeland is a 29 y.o. [phone] at [redacted]w[redacted]d who presents to maternity admissions reporting vaginal bleeding. She woke up around 0200 and thought she might have leaked some urine, when she got up to the restroom she found she was having vaginal bleeding. Showed this CNM a large clot in toilet. She reports mild cramping. No previous bleed since approximately 12-14 weeks at which point she was diagnosed with [facility]. Recent US  at 31 weeks with [facility] no longer seen per patient. Reports regular and vigorous fetal movement, denies LOF.   Previous C/S x1 for breech presentation. Reports she had a uterine septum which was repaired after delivery of previous child.   Pregnancy Course: [facility]  Past Medical History:  Diagnosis Date   Migraines    OB History  Gravida Para Term Preterm AB Living  5 1 1  3 1   SAB IAB Ectopic Multiple Live Births  3   0 1    # Outcome Date GA Lbr Len/2nd Weight Sex Type Anes PTL Lv  5 Current           4 SAB 12/29/22          3 SAB 11/30/22          2 SAB 10/30/22          1 Term 07/03/21 [redacted]w[redacted]d  3396 g F CS-LTranv Spinal  LIV   Past Surgical History:  Procedure Laterality Date   CESAREAN SECTION N/A 07/03/2021   Procedure: CESAREAN SECTION;  Surgeon: Delana Ted Morrison, DO;  Location: [facility]S;  Service: Obstetrics;  Laterality: N/A;  Primary C/S Breech   UTERINE SEPTUM RESECTION N/A 02/05/2024   Procedure: RESECTION, SEPTUM, UTERUS;  Surgeon: Okey Leader, MD;  Location: [facility] OR;  Service: Gynecology;  Laterality: N/A;   WISDOM TOOTH EXTRACTION     Family History  Problem Relation Age of Onset   Healthy Mother    Diabetes Father    Bone cancer Father    Healthy Sister    Healthy Brother    Diabetes Maternal Grandmother    Diabetes Maternal Grandfather    Diabetes Paternal Grandmother    Diabetes Paternal Grandfather    Social History[1] Allergies[2] Medications Prior to Admission   Medication Sig Dispense Refill Last Dose/Taking   doxylamine, Sleep, (UNISOM) 25 MG tablet Take 25 mg by mouth at bedtime as needed for sleep.   11/19/2024 Evening   ferrous sulfate 325 (65 FE) MG tablet Take 325 mg by mouth daily with breakfast.   11/19/2024 Evening   prenatal vitamin w/FE, FA (NATACHEW) 29-1 MG CHEW chewable tablet Chew 1 tablet by mouth daily at 12 noon.   11/19/2024 Evening   pyridOXINE (VITAMIN B6) 25 MG tablet Take 25 mg by mouth daily.   11/19/2024 Evening   estradiol  (ESTRACE ) 2 MG tablet Take 1 tablet (2 mg total) by mouth daily. (Patient not taking: Reported on 11/20/2024) 30 tablet 0 Not Taking   ibuprofen  (ADVIL ) 600 MG tablet Take 1 tablet (600 mg total) by mouth every 6 (six) hours as needed. (Patient not taking: Reported on 11/20/2024) 90 tablet 0 Not Taking   medroxyPROGESTERone  (PROVERA ) 10 MG tablet Take 1 tablet (10 mg total) by mouth daily. Take 1 tablet daily for 5 days during the last 5 days of estrogen therapy (Patient not taking: Reported on 11/20/2024) 5 tablet 0 Not Taking    I have  reviewed patient's Past Medical Hx, Surgical Hx, Family Hx, Social Hx, medications and allergies.   ROS  Pertinent items noted in HPI and remainder of comprehensive ROS otherwise negative.   PHYSICAL EXAM  Patient Vitals for the past 24 hrs:  BP Temp Temp src Pulse Resp SpO2 Height Weight  11/20/24 0353 (!) 145/94 -- -- (!) 102 -- 100 % -- --  11/20/24 0336 128/89 98.1 F (36.7 C) Oral (!) 113 16 100 % 5' 7 (1.702 m) 54.1 kg    Physical Exam Vitals and nursing note reviewed. Exam conducted with a chaperone present.  Constitutional:      General: She is not in acute distress.    Appearance: Normal appearance. She is not ill-appearing, toxic-appearing or diaphoretic.  HENT:     Head: Normocephalic.  Cardiovascular:     Rate and Rhythm: Normal rate.     Pulses: Normal pulses.  Pulmonary:     Effort: Pulmonary effort is normal.  Abdominal:     Palpations: Abdomen is  soft.     Tenderness: There is no abdominal tenderness.     Comments: Gravid  Genitourinary:    General: Normal vulva.     Vagina: Bleeding present.     Comments: Blood noted at introitus and on pad.  Speculum used to assess with small-moderate bleeding noted. Moderate clot sitting in cervix, unable to remove with Fox swab.  Skin:    General: Skin is warm and dry.     Capillary Refill: Capillary refill takes less than 2 seconds.  Neurological:     General: No focal deficit present.     Mental Status: She is alert and oriented to person, place, and time.  Psychiatric:        Mood and Affect: Mood normal.        Behavior: Behavior normal.        Thought Content: Thought content normal.        Judgment: Judgment normal.         Fetal Tracing: Baseline: 125 Variability: moderate Accelerations: 15x15 Decelerations: absent Toco: 4-5   Labs: No results found for this or any previous visit (from the past 24 hours).  Imaging:  No results found.  MDM & MAU COURSE  MDM: Moderate Reviewed PN records. PE with moderate vaginal bleeding noted. Will send for ultrasound for evaluation of placenta. Ultrasound report benign. Concern for cause of bleeding, consulted Dr. Cleatus who agreed with recommendation for admission to [facility] for observation. Recommendations communicated to patient's private OB, Dr. Okey who agreed with recommendations and plan to observe. Incidentally noted mild range BP, PEC labs ordered. Hgb stable. CMP pending at time of admission.  MAU Course: Orders Placed This Encounter  Procedures   US  MFM OB Limited   Urinalysis, Routine w reflex microscopic -Urine, Clean Catch   CBC   No orders of the defined types were placed in this encounter.   ASSESSMENT   1. Vaginal bleeding during pregnancy   2. [redacted] weeks gestation of pregnancy     PLAN  Admit to [facility] for observation. Care turned over to Dr. Okey and colleagues.   Camie Rote, MSN,  CNM 11/20/2024 4:25 AM  Certified Nurse Midwife, [facility]        [1]  Social History Tobacco Use   Smoking status: Never   Smokeless tobacco: Never  Vaping Use   Vaping status: Never Used  Substance Use Topics   Alcohol use: Never   Drug use: Never  [  2] No Known Allergies

## 2024-11-20 NOTE — MAU Note (Signed)
 Lindsay Copeland Crickett Abbett is a 29 y.o. at [redacted]w[redacted]d here in MAU reporting: woke up with VB - thought she was peeing on herself because it was dripping out. Used restroom and passed a large clot - has continued to have bleeding. Reports some cramping on lower left side of her abdomen. +FM. Denies recent intercourse. Had subchorionic hematoma but was not seen on last US . Denies LOF.   Onset of complaint: 0240 Pain score: 1 Vitals:   11/20/24 0336  BP: 128/89  Pulse: (!) 113  Resp: 16  Temp: 98.1 F (36.7 C)  SpO2: 100%     FHT: 137  Lab orders placed from triage: UA

## 2024-11-20 NOTE — Plan of Care (Signed)

## 2024-11-20 NOTE — MAU Note (Signed)
 RN called lab to inquire about adding p/c ratio to sample that is already in the lab. Lab tech informed RN that they had the lab requisition and would locate the sample and begin processing.

## 2024-11-21 NOTE — Progress Notes (Signed)
 [address] Lindsay Copeland is a 29 y.o. [phone] at [redacted]w[redacted]d admitted for observation in setting of vaginal bleeding.   Subjective: Patient is doing well. Denies abdominal pain or contractions. Continues to have intermittent dark brown spotting, but denies bright red bleeding or clots. Reports good fetal movement. Denies HA, VC, CP, SOB or RUQ pain.   Objective: BP 120/80   Pulse 88   Temp 98.2 F (36.8 C) (Oral)   Resp 18   Ht 5' 7 (1.702 m)   Wt 54.1 kg   LMP  (Exact Date)   SpO2 100%   BMI 18.67 kg/m  No intake/output data recorded. No intake/output data recorded.  FHT: FHR 120, moderate variability, accelerations +, - deceleration. Toco: irregular  Cat 1    Labs: Lab Results  Component Value Date   WBC 8.9 11/20/2024   HGB 11.1 (L) 11/20/2024   HCT 33.9 (L) 11/20/2024   MCV 85.8 11/20/2024   PLT 247 11/20/2024    Assessment / Plan: 29 y.o. [id number] at [redacted]w[redacted]d admitted for observation in setting of vaginal bleeding.   VB: stable, no bright red bleeding or abdominal pain. No concern for labor or abruption at this time. gHTN: BP normotensive to mild range. Discussed with patient new diagnosis and recommendation for weekly ANT/BP assessment and delivery at 37w   Dispo: stable for discharge home today. Has f/u scheduled on 11/28/24   Lindsay CHRISTELLA Oz, MD 11/21/2024, 7:30 AM

## 2024-11-21 NOTE — Plan of Care (Signed)
  Problem: Education: Goal: Knowledge of General Education information will improve Description: Including pain rating scale, medication(s)/side effects and non-pharmacologic comfort measures Outcome: Adequate for Discharge   Problem: Health Behavior/Discharge Planning: Goal: Ability to manage health-related needs will improve Outcome: Adequate for Discharge   Problem: Clinical Measurements: Goal: Ability to maintain clinical measurements within normal limits will improve Outcome: Adequate for Discharge Goal: Will remain free from infection Outcome: Adequate for Discharge Goal: Diagnostic test results will improve Outcome: Adequate for Discharge Goal: Respiratory complications will improve Outcome: Adequate for Discharge Goal: Cardiovascular complication will be avoided Outcome: Adequate for Discharge   Problem: Activity: Goal: Risk for activity intolerance will decrease Outcome: Adequate for Discharge   Problem: Nutrition: Goal: Adequate nutrition will be maintained Outcome: Adequate for Discharge   Problem: Coping: Goal: Level of anxiety will decrease Outcome: Adequate for Discharge   Problem: Elimination: Goal: Will not experience complications related to bowel motility Outcome: Adequate for Discharge Goal: Will not experience complications related to urinary retention Outcome: Adequate for Discharge   Problem: Pain Managment: Goal: General experience of comfort will improve and/or be controlled Outcome: Adequate for Discharge   Problem: Safety: Goal: Ability to remain free from injury will improve Outcome: Adequate for Discharge   Problem: Skin Integrity: Goal: Risk for impaired skin integrity will decrease Outcome: Adequate for Discharge   Problem: Education: Goal: Knowledge of disease or condition will improve Outcome: Adequate for Discharge Goal: Knowledge of the prescribed therapeutic regimen will improve Outcome: Adequate for Discharge Goal:  Individualized Educational Video(s) Outcome: Adequate for Discharge   Problem: Clinical Measurements: Goal: Complications related to the disease process, condition or treatment will be avoided or minimized Outcome: Adequate for Discharge

## 2024-11-21 NOTE — Progress Notes (Signed)
 Discharge AVS instructions reviewed with patient, understanding verbalized for when to return to MAU, how to take medications, and fetal kick counts.

## 2024-11-21 NOTE — Discharge Summary (Signed)
 Obstetrics Discharge Summary  Patient Name: Lindsay Copeland DOB: 09/22/1996 MRN: [id number]  Date of admission: 11/20/2024 Date of discharge: 11/21/2024  Admitting diagnosis: Vaginal bleeding during pregnancy [O46.90] Intrauterine pregnancy: [redacted]w[redacted]d     Secondary diagnosis:  Principal Problem:   Vaginal bleeding during pregnancy  Additional problems: history of cesarean section, recurrent miscarriage, uterine septum, history of subchorionic hematoma  Discharge diagnosis: Vaginal bleeding in pregnancy, stable; [facility] course: Patient presented to MAU for unprovoked vaginal bleeding. On exam in MAU bleeding noted to be heavy. However, US  unremarkable w/o concern for placental bleeding and patient without pain. She was admitted for 24hr obs. Bleeding transitioned to dark brown spotting and she denied abdominal pain or contractions. During her admission, she ruled in for gestational hypertension. PIH labs were collected and were unremarkable. She was stable for discharge home with close follow-up on 11/22/23.   Magnesium  Sulfate received: No BMZ received: No Rhophylac:N/A  Immunizations administered: Immunization History  Administered Date(s) Administered   Influenza,inj,Quad PF,6+ Mos 08/23/2018    Physical exam  Vitals:   11/20/24 1529 11/20/24 1935 11/21/24 0600 11/21/24 0800  BP: (!) 134/90 132/84 120/80 (!) 125/93  Pulse: 95 95 88 (!) 111  Resp: 18 18  17   Temp: 97.6 F (36.4 C) 98.2 F (36.8 C)  98.3 F (36.8 C)  TempSrc: Oral Oral  Oral  SpO2: 99% 100%  100%  Weight:      Height:       General: alert, cooperative, and no distress Respiratory: no increased work of breathing  Uterine Fundus: soft, non-tender DVT Evaluation: No evidence of DVT seen on physical exam. Labs: Lab Results  Component Value Date   WBC 8.9 11/20/2024   HGB 11.1 (L) 11/20/2024   HCT 33.9 (L) 11/20/2024   MCV 85.8 11/20/2024   PLT  247 11/20/2024      Latest Ref Rng & Units 11/20/2024    5:09 AM  CMP  Glucose 70 - 99 mg/dL 89   BUN 6 - 20 mg/dL 7   Creatinine 9.55 - 8.99 mg/dL 9.43   Sodium 864 - 854 mmol/L 136   Potassium 3.5 - 5.1 mmol/L 3.6   Chloride 98 - 111 mmol/L 105   CO2 22 - 32 mmol/L 20   Calcium  8.9 - 10.3 mg/dL 8.6   Total Protein 6.5 - 8.1 g/dL 6.3   Total Bilirubin 0.0 - 1.2 mg/dL 0.3   Alkaline Phos 38 - 126 U/L 134   AST 15 - 41 U/L 23   ALT 0 - 44 U/L 13    Edinburgh Score:    07/04/2021    8:26 AM  Edinburgh Postnatal Depression Scale Screening Tool  I have been able to laugh and see the funny side of things. 0   I have looked forward with enjoyment to things. 0   I have blamed myself unnecessarily when things went wrong. 0   I have been anxious or worried for no good reason. 0   I have felt scared or panicky for no good reason. 0   Things have been getting on top of me. 0   I have been so unhappy that I have had difficulty sleeping. 0   I have  felt sad or miserable. 0   I have been so unhappy that I have been crying. 0   The thought of harming myself has occurred to me. 0   Edinburgh Postnatal Depression Scale Total 0      Data saved with a previous flowsheet row definition      After visit meds:  Allergies as of 11/21/2024   No Known Allergies      Medication List     STOP taking these medications    estradiol  2 MG tablet Commonly known as: ESTRACE    ibuprofen  600 MG tablet Commonly known as: ADVIL    medroxyPROGESTERone  10 MG tablet Commonly known as: Provera        TAKE these medications    doxylamine (Sleep) 25 MG tablet Commonly known as: UNISOM Take 25 mg by mouth at bedtime as needed for sleep.   ferrous sulfate 325 (65 FE) MG tablet Take 325 mg by mouth daily with breakfast.   prenatal vitamin w/FE, FA 29-1 MG Chew chewable tablet Chew 1 tablet by mouth daily at 12 noon.   pyridOXINE 25 MG tablet Commonly known as: VITAMIN B6 Take 25 mg by  mouth daily.         Discharge home in stable condition Discharge instruction: bleeding precautions discussed, as well as, signs/symptoms of pre-eclampsia  Follow up Visit: has appointment on 11/28/24, will need NST and [facility] labs at this visit      11/21/2024 [name], MD

## 2024-11-25 ENCOUNTER — Encounter (HOSPITAL_COMMUNITY): Payer: Self-pay | Admitting: Obstetrics and Gynecology

## 2024-11-25 ENCOUNTER — Inpatient Hospital Stay (HOSPITAL_COMMUNITY)
Admission: AD | Admit: 2024-11-25 | Discharge: 2024-11-27 | DRG: 805 | Disposition: A | Discharge status: Home / Self Care | Attending: Obstetrics and Gynecology | Admitting: Obstetrics and Gynecology

## 2024-11-25 ENCOUNTER — Inpatient Hospital Stay (HOSPITAL_COMMUNITY): Admitting: Anesthesiology

## 2024-11-25 ENCOUNTER — Other Ambulatory Visit: Payer: Self-pay

## 2024-11-25 DIAGNOSIS — O4593 Premature separation of placenta, unspecified, third trimester: Secondary | ICD-10-CM | POA: Diagnosis present

## 2024-11-25 DIAGNOSIS — Z3A35 35 weeks gestation of pregnancy: Secondary | ICD-10-CM

## 2024-11-25 DIAGNOSIS — Q5128 Other doubling of uterus, other specified: Secondary | ICD-10-CM | POA: Diagnosis not present

## 2024-11-25 DIAGNOSIS — O3403 Maternal care for unspecified congenital malformation of uterus, third trimester: Secondary | ICD-10-CM | POA: Diagnosis present

## 2024-11-25 DIAGNOSIS — Z833 Family history of diabetes mellitus: Secondary | ICD-10-CM

## 2024-11-25 DIAGNOSIS — O34219 Maternal care for unspecified type scar from previous cesarean delivery: Secondary | ICD-10-CM | POA: Diagnosis present

## 2024-11-25 DIAGNOSIS — O326XX0 Maternal care for compound presentation, not applicable or unspecified: Secondary | ICD-10-CM | POA: Diagnosis present

## 2024-11-25 DIAGNOSIS — O9081 Anemia of the puerperium: Secondary | ICD-10-CM | POA: Diagnosis not present

## 2024-11-25 DIAGNOSIS — D62 Acute posthemorrhagic anemia: Secondary | ICD-10-CM | POA: Diagnosis not present

## 2024-11-25 DIAGNOSIS — O42913 Preterm premature rupture of membranes, unspecified as to length of time between rupture and onset of labor, third trimester: Secondary | ICD-10-CM | POA: Diagnosis present

## 2024-11-25 LAB — CBC
HCT: 35.4 % — ABNORMAL LOW (ref 36.0–46.0)
Hemoglobin: 11.5 g/dL — ABNORMAL LOW (ref 12.0–15.0)
MCH: 28.5 pg (ref 26.0–34.0)
MCHC: 32.5 g/dL (ref 30.0–36.0)
MCV: 87.6 fL (ref 80.0–100.0)
Platelets: 253 10*3/uL (ref 150–400)
RBC: 4.04 MIL/uL (ref 3.87–5.11)
RDW: 13.2 % (ref 11.5–15.5)
WBC: 9.6 10*3/uL (ref 4.0–10.5)
nRBC: 0 % (ref 0.0–0.2)

## 2024-11-25 LAB — SYPHILIS: RPR W/REFLEX TO RPR TITER AND TREPONEMAL ANTIBODIES, TRADITIONAL SCREENING AND DIAGNOSIS ALGORITHM: RPR Ser Ql: NONREACTIVE

## 2024-11-25 LAB — TYPE AND SCREEN
ABO/RH(D): A POS
Antibody Screen: NEGATIVE

## 2024-11-25 LAB — POCT FERN TEST: POCT Fern Test: POSITIVE

## 2024-11-25 MED ORDER — LIDOCAINE HCL (PF) 1 % IJ SOLN
INTRAMUSCULAR | Status: DC | PRN
  Administered 2024-11-25: 3 mL via SUBCUTANEOUS

## 2024-11-25 MED ORDER — ACETAMINOPHEN 325 MG PO TABS
650.0000 mg | ORAL_TABLET | ORAL | Status: DC | PRN
  Administered 2024-11-25: 650 mg via ORAL
  Filled 2024-11-25: qty 2

## 2024-11-25 MED ORDER — EPHEDRINE 5 MG/ML INJ: 10.0000 mg | INTRAVENOUS | Status: DC | PRN

## 2024-11-25 MED ORDER — SOD CITRATE-CITRIC ACID 500-334 MG/5ML PO SOLN
30.0000 mL | ORAL | Status: DC | PRN
  Filled 2024-11-25: qty 30

## 2024-11-25 MED ORDER — COCONUT OIL OIL: 1.0000 | TOPICAL_OIL | Status: DC | PRN

## 2024-11-25 MED ORDER — TERBUTALINE SULFATE 1 MG/ML IJ SOLN
INTRAMUSCULAR | Status: AC
  Administered 2024-11-25: 0.25 mg
  Filled 2024-11-25: qty 1

## 2024-11-25 MED ORDER — METHYLERGONOVINE MALEATE 0.2 MG/ML IJ SOLN: 0.2000 mg | INTRAMUSCULAR | Status: DC | PRN

## 2024-11-25 MED ORDER — DIPHENHYDRAMINE HCL 50 MG/ML IJ SOLN: 12.5000 mg | INTRAMUSCULAR | Status: DC | PRN

## 2024-11-25 MED ORDER — TETANUS-DIPHTH-ACELL PERTUSSIS 5-2-15.5 LF-MCG/0.5 IM SUSP: 0.5000 mL | Freq: Once | INTRAMUSCULAR | Status: DC

## 2024-11-25 MED ORDER — SIMETHICONE 80 MG PO CHEW
80.0000 mg | CHEWABLE_TABLET | ORAL | Status: DC | PRN
  Administered 2024-11-27: 80 mg via ORAL
  Filled 2024-11-25: qty 1

## 2024-11-25 MED ORDER — ONDANSETRON HCL 4 MG/2ML IJ SOLN: 4.0000 mg | INTRAMUSCULAR | Status: DC | PRN

## 2024-11-25 MED ORDER — BENZOCAINE-MENTHOL 20-0.5 % EX AERO
1.0000 | INHALATION_SPRAY | CUTANEOUS | Status: DC | PRN
  Administered 2024-11-25 – 2024-11-27 (×2): 1 via TOPICAL
  Filled 2024-11-25 (×2): qty 56

## 2024-11-25 MED ORDER — LACTATED RINGERS IV SOLN: INTRAVENOUS | Status: DC

## 2024-11-25 MED ORDER — LACTATED RINGERS IV SOLN
500.0000 mL | Freq: Once | INTRAVENOUS | Status: AC
  Administered 2024-11-25: 500 mL via INTRAVENOUS

## 2024-11-25 MED ORDER — OXYTOCIN BOLUS FROM INFUSION: 333.0000 mL | Freq: Once | INTRAVENOUS | Status: DC

## 2024-11-25 MED ORDER — SENNOSIDES-DOCUSATE SODIUM 8.6-50 MG PO TABS
2.0000 | ORAL_TABLET | Freq: Every day | ORAL | Status: DC
  Administered 2024-11-26: 2 via ORAL
  Filled 2024-11-25: qty 2

## 2024-11-25 MED ORDER — ONDANSETRON HCL 4 MG PO TABS: 4.0000 mg | ORAL_TABLET | ORAL | Status: DC | PRN

## 2024-11-25 MED ORDER — OXYTOCIN-SODIUM CHLORIDE 30-0.9 UT/500ML-% IV SOLN
1.0000 m[IU]/min | INTRAVENOUS | Status: DC
  Administered 2024-11-25: 2 m[IU]/min via INTRAVENOUS

## 2024-11-25 MED ORDER — FENTANYL-BUPIVACAINE-NACL 0.5-0.125-0.9 MG/250ML-% EP SOLN
12.0000 mL/h | EPIDURAL | Status: DC | PRN
  Administered 2024-11-25: 12 mL/h via EPIDURAL
  Filled 2024-11-25: qty 250

## 2024-11-25 MED ORDER — AMMONIA AROMATIC IN INHA
RESPIRATORY_TRACT | Status: AC
  Filled 2024-11-25: qty 10

## 2024-11-25 MED ORDER — PRENATAL MULTIVITAMIN CH
1.0000 | ORAL_TABLET | Freq: Every day | ORAL | Status: DC
  Administered 2024-11-25 – 2024-11-27 (×3): 1 via ORAL
  Filled 2024-11-25 (×3): qty 1

## 2024-11-25 MED ORDER — OXYTOCIN-SODIUM CHLORIDE 30-0.9 UT/500ML-% IV SOLN: 2.5000 [IU]/h | INTRAVENOUS | Status: DC | PRN

## 2024-11-25 MED ORDER — METHYLERGONOVINE MALEATE 0.2 MG PO TABS: 0.2000 mg | ORAL_TABLET | ORAL | Status: DC | PRN

## 2024-11-25 MED ORDER — ZOLPIDEM TARTRATE 5 MG PO TABS: 5.0000 mg | ORAL_TABLET | Freq: Every evening | ORAL | Status: DC | PRN

## 2024-11-25 MED ORDER — LIDOCAINE HCL (PF) 1 % IJ SOLN: 30.0000 mL | INTRAMUSCULAR | Status: DC | PRN

## 2024-11-25 MED ORDER — TERBUTALINE SULFATE 1 MG/ML IJ SOLN: 0.2500 mg | Freq: Once | INTRAMUSCULAR | Status: DC | PRN

## 2024-11-25 MED ORDER — OXYCODONE-ACETAMINOPHEN 5-325 MG PO TABS: 2.0000 | ORAL_TABLET | ORAL | Status: DC | PRN

## 2024-11-25 MED ORDER — OXYTOCIN-SODIUM CHLORIDE 30-0.9 UT/500ML-% IV SOLN
2.5000 [IU]/h | INTRAVENOUS | Status: DC
  Filled 2024-11-25: qty 500

## 2024-11-25 MED ORDER — PHENYLEPHRINE 80 MCG/ML (10ML) SYRINGE FOR IV PUSH (FOR BLOOD PRESSURE SUPPORT): 80.0000 ug | PREFILLED_SYRINGE | INTRAVENOUS | Status: DC | PRN

## 2024-11-25 MED ORDER — OXYCODONE HCL 5 MG PO TABS: 10.0000 mg | ORAL_TABLET | ORAL | Status: DC | PRN

## 2024-11-25 MED ORDER — DIPHENHYDRAMINE HCL 25 MG PO CAPS: 25.0000 mg | ORAL_CAPSULE | Freq: Four times a day (QID) | ORAL | Status: DC | PRN

## 2024-11-25 MED ORDER — ONDANSETRON HCL 4 MG/2ML IJ SOLN: 4.0000 mg | Freq: Four times a day (QID) | INTRAMUSCULAR | Status: DC | PRN

## 2024-11-25 MED ORDER — SODIUM CHLORIDE 0.9 % IV SOLN
INTRAVENOUS | Status: DC | PRN
  Administered 2024-11-25: 6 mL via EPIDURAL

## 2024-11-25 MED ORDER — FLEET ENEMA RE ENEM: 1.0000 | ENEMA | RECTAL | Status: DC | PRN

## 2024-11-25 MED ORDER — SODIUM CHLORIDE 0.9 % IV SOLN
2.0000 g | Freq: Once | INTRAVENOUS | Status: AC
  Administered 2024-11-25: 2 g via INTRAVENOUS
  Filled 2024-11-25: qty 2000

## 2024-11-25 MED ORDER — IBUPROFEN 600 MG PO TABS
600.0000 mg | ORAL_TABLET | Freq: Four times a day (QID) | ORAL | Status: DC
  Administered 2024-11-25 – 2024-11-27 (×8): 600 mg via ORAL
  Filled 2024-11-25 (×8): qty 1

## 2024-11-25 MED ORDER — SODIUM CHLORIDE 0.9 % IV SOLN
1.0000 g | INTRAVENOUS | Status: DC
  Administered 2024-11-25: 1 g via INTRAVENOUS
  Filled 2024-11-25: qty 1000

## 2024-11-25 MED ORDER — WITCH HAZEL-GLYCERIN EX PADS
1.0000 | MEDICATED_PAD | CUTANEOUS | Status: DC | PRN
  Administered 2024-11-27: 1 via TOPICAL

## 2024-11-25 MED ORDER — LACTATED RINGERS IV SOLN: 500.0000 mL | INTRAVENOUS | Status: DC | PRN

## 2024-11-25 MED ORDER — OXYCODONE-ACETAMINOPHEN 5-325 MG PO TABS: 1.0000 | ORAL_TABLET | ORAL | Status: DC | PRN

## 2024-11-25 MED ORDER — OXYCODONE HCL 5 MG PO TABS: 5.0000 mg | ORAL_TABLET | ORAL | Status: DC | PRN

## 2024-11-25 MED ORDER — ACETAMINOPHEN 325 MG PO TABS: 650.0000 mg | ORAL_TABLET | ORAL | Status: DC | PRN

## 2024-11-25 MED ORDER — LIDOCAINE-EPINEPHRINE (PF) 2 %-1:200000 IJ SOLN
INTRAMUSCULAR | Status: DC | PRN
  Administered 2024-11-25: 3 mL via EPIDURAL

## 2024-11-25 MED ORDER — DIBUCAINE (PERIANAL) 1 % EX OINT
1.0000 | TOPICAL_OINTMENT | CUTANEOUS | Status: DC | PRN
  Administered 2024-11-27: 1 via RECTAL
  Filled 2024-11-25: qty 28

## 2024-11-25 NOTE — MAU Note (Signed)
 Lindsay Copeland is a 29 y.o. at [redacted]w[redacted]d here in MAU reporting: large gush in sleep around 0330, has not needed a pad for any additional leaking, mild ctx started around 0400  LMP:  Onset of complaint: 0330 Pain score: 2/10  FHT: pt by-passed triage and placed straight in MAU exam room   Lab orders placed from triage:

## 2024-11-25 NOTE — Lactation Note (Signed)
 This note was copied from a baby's chart. Lactation Consultation Note  Patient Name: Lindsay Copeland Date: 11/25/2024 Age:30 hours Reason for consult: Initial assessment;Late-preterm 34-36.6wks;Infant < 6lbs  P2. Experienced BF mom states understanding of LPI baby and special needs for feeding and conserving energy. LPI information reviewed as well as supplementing. Mom has pumped.  Discussed mom BF and supplementing and time limit. Discussed w/mom on if to BF first or supplement first. Discussed doing it on day by day bases on how baby's energy level is doing. Mom had 1 ml colostrum in container so LC spoon fed baby colostrum. Then [facility] taught how to pace feed and baby done better in side lying position. Discussed rest breaks and seeing when baby is tired not to push him to cont but let him rest then try again before formula expires after a rest break. Baby took 15 ml formula well. Mom happy that baby ate because she stated baby was so tired after 15 min. Of BF.  Feeding Plan: Mom is to use DEBP before feeding to help Prime breast for feeding. (Mom is to pump every 3 hrs) Mom can spoon feed colostrum as appetizer to get baby interested in feeding if sleepy before BF. ( If baby is cueing can put on the breast then give colostrum) ever how mom wants to do it. BF baby for 10 min. If baby is tired after BF then left baby take 20 min. Rest the try to give formula. Baby if to formula feed every 3 hrs. Give amount according to Plan of Care feeding plan.  Call for assistance or questions. Encouraged mom to call tomorrow for OP LC appt. For next week.  Maternal Data Does the patient have breastfeeding experience prior to this delivery?: Yes How long did the patient breastfeed?: 9 months to her now 29 yr old  Feeding Mother's Current Feeding Choice: Breast Milk and Formula Nipple Type: Nfant Standard Flow (white)  LATCH Score       Type of Nipple: Everted at rest and after  stimulation  Comfort (Breast/Nipple): Soft / non-tender         Lactation Tools Discussed/Used Tools: Pump;Flanges Flange Size: 24 Breast pump type: Double-Electric Breast Pump Pump Education: Milk Storage Reason for Pumping: LPI Pumping frequency: Q3 hr  Interventions Interventions: Breast feeding basics reviewed;Support pillows;Position options;DEBP;Education;Pace feeding;LC Services brochure;CDC milk storage guidelines  Discharge Discharge Education: Outpatient recommendation Pump: DEBP  Consult Status Consult Status: Follow-up Date: 11/26/24 Follow-up type: In-patient    Roshan Salamon G 11/25/2024, 9:11 PM

## 2024-11-25 NOTE — Anesthesia Preprocedure Evaluation (Signed)
"                                    Anesthesia Evaluation  Patient identified by MRN, date of birth, ID band Patient awake  General Assessment Comment: TOLAC.  I entered room while patient was being evaluated for possibility of stat cesarean for fetal bradycardia. Per OB staff, FHR stabilized, and patient/staff requested an epidural to improve comfort and facilitate OB interventions.  Reviewed: Allergy & Precautions, NPO status , Patient's Chart, lab work & pertinent test results  History of Anesthesia Complications Negative for: history of anesthetic complications  Airway Mallampati: II  TM Distance: >3 FB Neck ROM: Full    Dental no notable dental hx. (+) Teeth Intact   Pulmonary neg pulmonary ROS, neg sleep apnea, neg COPD, Patient abstained from smoking.Not current smoker   Pulmonary exam normal breath sounds clear to auscultation       Cardiovascular Exercise Tolerance: Good METS(-) hypertension(-) CAD and (-) Past MI negative cardio ROS (-) dysrhythmias  Rhythm:Regular Rate:Normal - Systolic murmurs    Neuro/Psych  Headaches  negative psych ROS   GI/Hepatic ,neg GERD  ,,(+)     (-) substance abuse    Endo/Other  neg diabetes    Renal/GU negative Renal ROS     Musculoskeletal   Abdominal   Peds  Hematology  (+) Blood dyscrasia, anemia Denies blood thinner use or bleeding disorders.    Anesthesia Other Findings Denies blood thinner use or bleeding diatheses. Recent labs reviewed. Past Medical History: No date: Migraines   Reproductive/Obstetrics (+) Pregnancy                              Anesthesia Physical Anesthesia Plan  ASA: 2  Anesthesia Plan: Epidural   Post-op Pain Management:    Induction:   PONV Risk Score and Plan: 2 and Treatment may vary due to age or medical condition and Ondansetron   Airway Management Planned: Natural Airway  Additional Equipment:   Intra-op Plan:   Post-operative Plan:    Informed Consent: I have reviewed the patients History and Physical, chart, labs and discussed the procedure including the risks, benefits and alternatives for the proposed anesthesia with the patient or authorized representative who has indicated his/her understanding and acceptance.       Plan Discussed with: Surgeon  Anesthesia Plan Comments: (Discussed R/B/A of neuraxial anesthesia technique with patient: - rare risks of spinal/epidural hematoma, nerve damage, infection - Risk of PDPH - Risk of itching - Risk of nausea and vomiting - Risk of poor block necessitating replacement of epidural. - Risk of allergic reactions. Patient voiced understanding.)         Anesthesia Quick Evaluation  "

## 2024-11-25 NOTE — Anesthesia Procedure Notes (Signed)
 Epidural Patient location during procedure: OB Start time: 11/25/2024 7:59 AM End time: 11/25/2024 8:01 AM  Staffing Anesthesiologist: Boone Fess, MD Performed: anesthesiologist   Preanesthetic Checklist Completed: patient identified, IV checked, site marked, risks and benefits discussed, surgical consent, monitors and equipment checked, pre-op evaluation and timeout performed  Epidural Patient position: sitting Prep: ChloraPrep Patient monitoring: heart rate, continuous pulse ox and blood pressure Approach: midline Location: L4-L5 Injection technique: LOR saline  Needle:  Needle insertion depth (cm): 3 Needle type: Tuohy  Needle gauge: 17 G Needle length: 9 cm Needle insertion depth: 3 cm Catheter type: closed end flexible Catheter size: 19 Gauge Catheter at skin depth: 10 cm Test dose: negative and 1.5% lidocaine  with Epi 1:200 K  Assessment Sensory level: T10 Events: blood not aspirated, no cerebrospinal fluid, injection not painful, no injection resistance, no paresthesia and negative IV test  Additional Notes First/one attempt Pt. Evaluated and documentation done after procedure finished. Patient identified. Risks/Benefits/Options discussed with patient including but not limited to bleeding, infection, nerve damage, paralysis, failed block, incomplete pain control, headache, blood pressure changes, nausea, vomiting, reactions to medication both or allergic, itching and postpartum back pain. Confirmed with bedside nurse the patient's most recent platelet count. Confirmed with patient that they are not currently taking any anticoagulation, have any bleeding history or any family history of bleeding disorders. Patient expressed understanding and wished to proceed. All questions were answered. Sterile technique was used throughout the entire procedure. Please see nursing notes for vital signs. Test dose was given through epidural catheter and negative prior to continuing to dose  epidural or start infusion. Warning signs of high block given to the patient including shortness of breath, tingling/numbness in hands, complete motor block, or any concerning symptoms with instructions to call for help. Patient was given instructions on fall risk and not to get out of bed. All questions and concerns addressed with instructions to call with any issues or inadequate analgesia.     Patient tolerated the insertion well without immediate complications.  Reason for block: procedure for painReason for block:procedure for pain

## 2024-11-25 NOTE — MAU Note (Signed)

## 2024-11-25 NOTE — Progress Notes (Incomplete)
 RN helping pt move over to the wheelchair to transfer to MB. Pt stated she felt like she might throw up and felt lightheaded. RN called for additional RNs, restarted LR at bolus rate. Additional RNs came with ammonia  and apple juice. Pt responded to ammonia  and was able to drink apple juice and reported feeling much better. Pt transferred safely to MB 407.

## 2024-11-25 NOTE — H&P (Signed)
 [address] Lindsay Copeland is a 29 y.o. [phone] female at [redacted]w[redacted]d presenting for PPROM and preterm labor. Shatina reports a large gush of fluid at 3:30am this morning, followed by continuous leakage. Fern positive in MAU. Sawyer was recently admitted 1/22-1/23 for vaginal bleeding in the setting of subchorionic hematoma, but has not had any heavy vaginal bleeding, only brown spotting since her discharge. She reports baby is moving well. She is currently having moderate contractions every 3-4 minutes.   Most recent growth US  at [redacted]w[redacted]d showed EFW 1814 gm (4#0)  Her pregnancy is otherwise complicated by: -subchorionic hematoma: as above -history of prior C-section: for breech presentation. Per [facility] TOLAC calculator, patient with 86% chance of successful VBAC. Discussed R/B of TOLAC vs. repeat C-sec in clinic, patient has TOLAC consent on file.  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 OB History     Gravida  5   Para  1   Term  1   Preterm      AB  3   Living  1      SAB  3   IAB      Ectopic      Multiple  0   Live Births  1          Past Medical History:  Diagnosis Date   Migraines    Past Surgical History:  Procedure Laterality Date   CESAREAN SECTION N/A 07/03/2021   Procedure: CESAREAN SECTION;  Surgeon: Delana Ted Morrison, DO;  Location: [facility] LD ORS;  Service: Obstetrics;  Laterality: N/A;  Primary C/S Breech   UTERINE SEPTUM RESECTION N/A 02/05/2024   Procedure: RESECTION, SEPTUM, UTERUS;  Surgeon: Okey Leader, MD;  Location: [facility] OR;  Service: Gynecology;  Laterality: N/A;   WISDOM TOOTH EXTRACTION     Family History: family history includes Bone cancer in her father; Diabetes in her father, maternal grandfather, maternal grandmother, paternal grandfather, and paternal grandmother; Healthy in her brother, mother, and sister. Social History:  reports that she has never smoked. She has never used smokeless tobacco. She reports that she does  not drink alcohol and does not use drugs.     Maternal Diabetes: No Genetic Screening: Normal Maternal Ultrasounds/Referrals: Normal and Other: subchorionic hematoma Fetal Ultrasounds or other Referrals:  None Maternal Substance Abuse:  No Significant Maternal Medications:  None Significant Maternal Lab Results:  Other: GBS unknown Number of Prenatal Visits:greater than 3 verified prenatal visits Maternal Vaccinations:TDap   Review of Systems  All other systems reviewed and are negative.  Maternal Medical History:  Reason for admission: Rupture of membranes and contractions.   Contractions: Onset was 3-5 hours ago.   Fetal activity: Perceived fetal activity is normal.   Prenatal complications: Bleeding and preterm labor.   No cholelithiasis, HIV, PIH, infection, IUGR, nephrolithiasis, oligohydramnios, placental abnormality, polyhydramnios, pre-eclampsia, substance abuse, thrombocytopenia or thrombophilia.   Prenatal Complications - Diabetes: none.   Dilation: 5 Effacement (%): 90 Station: 0 Exam by:: Lui Bellis, MD Blood pressure (!) 140/97, pulse 98, SpO2 100%. Maternal Exam:  Uterine Assessment: Contraction strength is moderate.  Abdomen: Patient reports no abdominal tenderness. Fetal presentation: vertex Introitus: Normal vulva. Ferning test: positive.  Amniotic fluid character: clear. Pelvis: adequate for delivery.   Cervix: Cervix evaluated by digital exam.     Fetal Exam Fetal Monitor Review: Baseline rate: 140.  Variability: moderate (6-25 bpm).   Pattern: accelerations present and early decelerations.   Fetal State Assessment: Category I - tracings are normal.  Physical Exam Vitals reviewed. Exam conducted with a chaperone present.  Constitutional:      Appearance: Normal appearance.  HENT:     Head: Normocephalic.  Eyes:     Extraocular Movements: Extraocular movements intact.  Cardiovascular:     Comments: Well perfused Pulmonary:     Effort:  Pulmonary effort is normal.  Abdominal:     Comments: Gravid, non-tender  Genitourinary:    General: Normal vulva.  Musculoskeletal:        General: Normal range of motion.     Cervical back: Normal range of motion.  Skin:    General: Skin is warm and dry.  Neurological:     General: No focal deficit present.     Mental Status: She is alert and oriented to person, place, and time.  Psychiatric:        Mood and Affect: Mood normal.        Behavior: Behavior normal.        Thought Content: Thought content normal.        Judgment: Judgment normal.     Prenatal labs: ABO, Rh: --/--/A POS (01/27 0526) Antibody: NEG (01/27 0526) Rubella:  Immune 06/09/24 RPR:   NR 10/02/24 HBsAg:   negative 06/09/24 HIV:   negative 06/09/24 GBS:   unk  Assessment/Plan: Lindsay Copeland is a 29 y.o. [phone] female at [redacted]w[redacted]d presenting for PPROM and preterm labor.  -PPROM/PTL: admitted to L&D. Expectant management. Ampicillin  for GBS unknown and preterm. Epidural per patient desire. -subchorionic hematoma -history of prior C-section: for breech presentation. Per [facility] TOLAC calculator, patient with 86% chance of successful VBAC. Discussed R/B of TOLAC vs. repeat C-sec in clinic, patient has TOLAC consent on file.  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 High OBH risk.  Dispo: Anticipate VBAC.    Lindsay Copeland A Kshawn Canal 11/25/2024, 6:27 AM

## 2024-11-26 LAB — CBC
HCT: 24.5 % — ABNORMAL LOW (ref 36.0–46.0)
Hemoglobin: 8 g/dL — ABNORMAL LOW (ref 12.0–15.0)
MCH: 28.7 pg (ref 26.0–34.0)
MCHC: 32.7 g/dL (ref 30.0–36.0)
MCV: 87.8 fL (ref 80.0–100.0)
Platelets: 199 10*3/uL (ref 150–400)
RBC: 2.79 MIL/uL — ABNORMAL LOW (ref 3.87–5.11)
RDW: 13.5 % (ref 11.5–15.5)
WBC: 13 10*3/uL — ABNORMAL HIGH (ref 4.0–10.5)
nRBC: 0 % (ref 0.0–0.2)

## 2024-11-26 MED ORDER — DIPHENHYDRAMINE HCL 50 MG/ML IJ SOLN: 25.0000 mg | Freq: Once | INTRAMUSCULAR | Status: DC | PRN

## 2024-11-26 MED ORDER — ALBUTEROL SULFATE (2.5 MG/3ML) 0.083% IN NEBU: 2.5000 mg | INHALATION_SOLUTION | Freq: Once | RESPIRATORY_TRACT | Status: DC | PRN

## 2024-11-26 MED ORDER — SODIUM CHLORIDE 0.9 % IV BOLUS: 500.0000 mL | Freq: Once | INTRAVENOUS | Status: DC | PRN

## 2024-11-26 MED ORDER — EPINEPHRINE 0.3 MG/0.3ML IJ SOAJ: 0.3000 mg | Freq: Once | INTRAMUSCULAR | Status: DC | PRN

## 2024-11-26 MED ORDER — FERROUS SULFATE 325 (65 FE) MG PO TABS: 325.0000 mg | ORAL_TABLET | ORAL | Status: DC

## 2024-11-26 MED ORDER — SODIUM CHLORIDE 0.9 % IV SOLN
500.0000 mg | Freq: Once | INTRAVENOUS | Status: AC
  Administered 2024-11-26: 500 mg via INTRAVENOUS
  Filled 2024-11-26: qty 25

## 2024-11-26 MED ORDER — SODIUM CHLORIDE 0.9 % IV SOLN: INTRAVENOUS | Status: AC | PRN

## 2024-11-26 MED ORDER — METHYLPREDNISOLONE SODIUM SUCC 125 MG IJ SOLR: 125.0000 mg | Freq: Once | INTRAMUSCULAR | Status: DC | PRN

## 2024-11-26 NOTE — Anesthesia Postprocedure Evaluation (Signed)
"   Anesthesia Post Note  Patient: Lindsay Copeland  Procedure(s) Performed: AN AD HOC LABOR EPIDURAL     Patient location during evaluation: Mother Baby Anesthesia Type: Epidural Level of consciousness: awake, awake and alert and oriented Pain management: satisfactory to patient Vital Signs Assessment: post-procedure vital signs reviewed and stable Respiratory status: spontaneous breathing, respiratory function stable and nonlabored ventilation Cardiovascular status: stable and blood pressure returned to baseline Postop Assessment: no headache, no backache, no apparent nausea or vomiting, able to ambulate, adequate PO intake and patient able to bend at knees Anesthetic complications: no   No notable events documented.  Last Vitals:  Vitals:   11/25/24 1950 11/25/24 2343  BP: 121/80 (P) 129/78  Pulse: (!) 105 (P) 99  Resp: 18 (P) 18  Temp: 36.8 C   SpO2: 100%     Last Pain:  Vitals:   11/26/24 0900  TempSrc:   PainSc: 0-No pain   Pain Goal:                Epidural/Spinal Function Cutaneous sensation: Normal sensation (11/26/24 0900), Patient able to flex knees: Yes (11/26/24 0900), Patient able to lift hips off bed: Yes (11/26/24 0900), Back pain beyond tenderness at insertion site: No (11/26/24 0900), Progressively worsening motor and/or sensory loss: No (11/26/24 0900), Bowel and/or bladder incontinence post epidural: No (11/26/24 0900)  Lindsay Copeland      "

## 2024-11-26 NOTE — Progress Notes (Signed)
 Post Partum Day 1 Subjective: Dizziness with ambulating, staying in bed mostly to minimize symptoms. Pain controlled. Voiding without difficulty  Objective: Blood pressure (P) 129/78, pulse (P) 99, temperature 98.2 F (36.8 C), temperature source Oral, resp. rate (P) 18, height 5' 7 (1.702 m), weight 54.1 kg, SpO2 100%, unknown if currently breastfeeding.  Physical Exam:  General: alert, cooperative, and no distress Lochia: appropriate Uterine Fundus: firm DVT Evaluation: No cords or calf tenderness. No significant calf/ankle edema.  Recent Labs    11/25/24 0526 11/26/24 0533  HGB 11.5* 8.0*  HCT 35.4* 24.5*    Assessment/Plan: 29 yo G4 now P1132 PPD#1 s/p PT-VBAC in setting of partial placental abruption after presenting with PTL and ROM 1) PP: routine care 2) Partial placental abruption with acute blood loss anemia, clinically significant for this hospitalization: Hb 11-->8. As she is also symptomatic, will IV iron  today. DC home with PO iron  3) Desire circumcision. Per peds will need to defer to work on feeding   LOS: 1 day   Larraine DELENA Sharps, DO 11/26/2024, 11:28 AM

## 2024-11-26 NOTE — Lactation Note (Signed)
 This note was copied from a baby's chart. Lactation Consultation Note  Patient Name: Lindsay Copeland Date: 11/26/2024 Age:30 hours Reason for consult: Follow-up assessment;Mother's request;Difficult latch;Late-preterm 34-36.6wks;Infant < 6lbs;Breastfeeding assistance;RN request  P2- Infant was born at [redacted]w[redacted]d GA weighing [phone] and has had a total weight loss of 2.19%. RN requested for this LC to consult with MOB because MOB was in tears due to infant not latching and the previous LC telling her to not latch infant (according to MOB). LC reviewed typical behaviors for a 35 weeker and the first 24 hr birthday nap. LC then reviewed the feeding plan with MOB.  Feeding plan as follows:  -Entire feeding to last 30 minutes or less. (15 min breast and 15 min bottle) -Supplement with EBM, DBM or formula with the volume as indicated on the LPI sheet provided. (This is the minimum) -Pump for 15 minutes to offer stimulation to breasts and collect EBM.  MOB agreed to this feeding plan. LC assisted MOB with placing infant on the left breast in the cross cradle hold. Infant was eager and latched immediately. Infant needed minimal stimulation to sustain sucking. Overall, infant was mostly chomping, but some nutritive sucking was noted. Infant nursed on and off for 15 minutes. LC praised MOB and infant for working so hard. LC encouraged MOB to pace feed infant the formula using the white nipple provided. LC does believe that infant may need a slower nipple though because MOB reports consistent gulping with the white nipple. LC informed RN of this and encouraged her to reach out to the speech team. LC encouraged MOB to call for further assistance as needed.  Maternal Data Has patient been taught Hand Expression?: Yes Does the patient have breastfeeding experience prior to this delivery?: Yes How long did the patient breastfeed?: 7 months  Feeding Mother's Current Feeding Choice: Breast Milk Nipple  Type: Slow - flow  LATCH Score Latch: Grasps breast easily, tongue down, lips flanged, rhythmical sucking. (chomping)  Audible Swallowing: A few with stimulation  Type of Nipple: Everted at rest and after stimulation  Comfort (Breast/Nipple): Soft / non-tender  Hold (Positioning): Assistance needed to correctly position infant at breast and maintain latch.  LATCH Score: 8   Lactation Tools Discussed/Used Tools: Pump;Flanges Flange Size: 21 (see note) Pump Education: Setup, frequency, and cleaning;Milk Storage Reason for Pumping: LPI/low birth weight infant Pumping frequency: 15-20 min every 3 hrs  Interventions Interventions: Breast feeding basics reviewed;Assisted with latch;Breast compression;Adjust position;Support pillows;Position options;Hand pump;DEBP;Education;Pace feeding;LC Services brochure;LPT handout/interventions  Discharge Discharge Education: Engorgement and breast care;Warning signs for feeding baby;Outpatient recommendation Pump: DEBP;Personal;Referral sent for [facility] Pump  Consult Status Consult Status: Follow-up Date: 11/27/24 Follow-up type: In-patient    Recardo Hoit BS, IBCLC 11/26/2024, 4:20 PM

## 2024-11-27 LAB — SURGICAL PATHOLOGY

## 2024-11-27 MED ORDER — BENZOCAINE-MENTHOL 20-0.5 % EX AERO: 1.0000 | INHALATION_SPRAY | CUTANEOUS | Status: AC | PRN

## 2024-11-27 MED ORDER — ACETAMINOPHEN 325 MG PO TABS: 650.0000 mg | ORAL_TABLET | Freq: Four times a day (QID) | ORAL | Status: AC | PRN

## 2024-11-27 MED ORDER — POLYETHYLENE GLYCOL 3350 17 GM/SCOOP PO POWD: 17.0000 g | Freq: Every day | ORAL | Status: AC | PRN

## 2024-11-27 MED ORDER — FERROUS SULFATE 325 (65 FE) MG PO TABS: 325.0000 mg | ORAL_TABLET | Freq: Every day | ORAL | Status: AC

## 2024-11-27 MED ORDER — IBUPROFEN 600 MG PO TABS: 600.0000 mg | ORAL_TABLET | Freq: Four times a day (QID) | ORAL | Status: AC | PRN

## 2024-11-27 MED ORDER — WITCH HAZEL-GLYCERIN EX PADS: 1.0000 | MEDICATED_PAD | CUTANEOUS | Status: AC | PRN

## 2024-11-27 MED ORDER — DIBUCAINE (PERIANAL) 1 % EX OINT: 1.0000 | TOPICAL_OINTMENT | CUTANEOUS | Status: AC | PRN

## 2024-11-27 NOTE — Lactation Note (Signed)
 This note was copied from a baby's chart. Lactation Consultation Note  Patient Name: Lindsay Copeland Unijb'd Date: 11/27/2024 Age:30 hours        Discharge Pump: Received [facility] Pump  Consult Status      Rollene Caldron Jd Mccaster 11/27/2024, 11:10 AM

## 2024-11-27 NOTE — Discharge Summary (Signed)
 Postpartum Discharge Summary  Patient Name: Lindsay Copeland DOB: 1996/01/22 MRN: [id number]  Date of admission: 11/25/2024 Delivery date:11/25/2024 Delivering provider: OKEY LEADER Date of discharge: 11/27/2024  Admitting diagnosis: Normal labor [O80, Z37.9] Spontaneous vaginal delivery [O80] Intrauterine pregnancy: [redacted]w[redacted]d     Secondary diagnosis:  Principal Problem:   Normal labor Active Problems:   Spontaneous vaginal delivery  Additional problems: PPROM, subchorionic hematoma, history of cesarean section, uterine septum    Discharge diagnosis: Preterm Pregnancy Delivered and Anemia                                              Post partum procedures: IV iron  infusion Augmentation: Pitocin  Complications: Placental Abruption  Hospital course: 29 y.o. yo [id number] at [redacted]w[redacted]d was admitted for PPROM on 11/25/2024. Labor course was complicated by placental abruption.   Membrane Rupture Time/Date: 3:30 AM,11/25/2024  Delivery Method:VBAC, Spontaneous Operative Delivery:N/A Episiotomy: Right Mediolateral Lacerations:  2nd degree Patient had a postpartum course complicated by symptomatic acute blood loss anemia. Admit Hgb 11, EBL 537 but likely higher due to uncalculated blood loss during induction/early pushing, PPD1 Hgb 8. She has IV iron  infusion on PPD1, with symptomatic relief noted on PPD2. She was prescribed PO iron  on discharge. Female fetus had circumcision on PPD2. She is breast feeding.  She is ambulating, tolerating a regular diet, passing flatus, and urinating well. Patient is discharged home in stable condition on 11/27/24.  Newborn Data: Birth date:11/25/2024 Birth time:11:46 AM Gender:Female Living status:Living Apgars:9 ,10  Weight:2510 g  Magnesium  Sulfate received: No BMZ received: No Rhophylac:N/A MMR:N/A T-DaP:Given prenatally Flu: No RSV Vaccine received: No Transfusion:No Immunizations administered: Immunization History  Administered Date(s) Administered    Influenza,inj,Quad PF,6+ Mos 08/23/2018    Physical exam  Vitals:   11/26/24 1436 11/26/24 2000 11/26/24 2329 11/27/24 0517  BP: 123/88 117/79  118/80  Pulse: 100 (!) 114 100 94  Resp: 18 18  18   Temp: 98.1 F (36.7 C)     TempSrc: Oral     SpO2: 100% 99% 99% 99%  Weight:      Height:       General: alert, cooperative, and no distress Lochia: appropriate Uterine Fundus: firm, below the umbilicus Incision: perineal incision not examined prior to d/c DVT Evaluation: No evidence of DVT seen on physical exam. Calf/Ankle edema is present Labs: Lab Results  Component Value Date   WBC 13.0 (H) 11/26/2024   HGB 8.0 (L) 11/26/2024   HCT 24.5 (L) 11/26/2024   MCV 87.8 11/26/2024   PLT 199 11/26/2024      Latest Ref Rng & Units 11/20/2024    5:09 AM  CMP  Glucose 70 - 99 mg/dL 89   BUN 6 - 20 mg/dL 7   Creatinine 9.55 - 8.99 mg/dL 9.43   Sodium 864 - 854 mmol/L 136   Potassium 3.5 - 5.1 mmol/L 3.6   Chloride 98 - 111 mmol/L 105   CO2 22 - 32 mmol/L 20   Calcium  8.9 - 10.3 mg/dL 8.6   Total Protein 6.5 - 8.1 g/dL 6.3   Total Bilirubin 0.0 - 1.2 mg/dL 0.3   Alkaline Phos 38 - 126 U/L 134   AST 15 - 41 U/L 23   ALT 0 - 44 U/L 13    Edinburgh Score:    11/26/2024   11:11 AM  Edinburgh Postnatal Depression Scale Screening  Tool  I have been able to laugh and see the funny side of things. 0  I have looked forward with enjoyment to things. 0  I have blamed myself unnecessarily when things went wrong. 1  I have been anxious or worried for no good reason. 1  I have felt scared or panicky for no good reason. 0  Things have been getting on top of me. 0  I have been so unhappy that I have had difficulty sleeping. 0  I have felt sad or miserable. 0  I have been so unhappy that I have been crying. 0  The thought of harming myself has occurred to me. 0  Edinburgh Postnatal Depression Scale Total 2      After visit meds:  Allergies as of 11/27/2024   No Known Allergies       Medication List     STOP taking these medications    doxylamine (Sleep) 25 MG tablet Commonly known as: UNISOM   pyridOXINE 25 MG tablet Commonly known as: VITAMIN B6       TAKE these medications    acetaminophen  325 MG tablet Commonly known as: Tylenol  Take 2 tablets (650 mg total) by mouth every 6 (six) hours as needed (for pain scale < 4).   benzocaine -Menthol  20-0.5 % Aero Commonly known as: DERMOPLAST Apply 1 Application topically as needed for irritation (perineal discomfort).   dibucaine 1 % Oint Commonly known as: NUPERCAINAL Place 1 Application rectally as needed for hemorrhoids.   ferrous sulfate  325 (65 FE) MG tablet Take 1 tablet (325 mg total) by mouth daily with breakfast.   ibuprofen  600 MG tablet Commonly known as: ADVIL  Take 1 tablet (600 mg total) by mouth every 6 (six) hours as needed.   polyethylene glycol powder 17 GM/SCOOP powder Commonly known as: MiraLax  Take 17 g by mouth daily as needed. Dissolve 1 capful (17g) in 4-8 ounces of liquid and take by mouth daily.   prenatal vitamin w/FE, FA 29-1 MG Chew chewable tablet Chew 1 tablet by mouth daily at 12 noon.   witch hazel-glycerin  pad Commonly known as: TUCKS Apply 1 Application topically as needed for hemorrhoids.        Discharge home in stable condition Infant Feeding: Breast Infant Disposition:rooming in Discharge instruction: per After Visit Summary and Postpartum booklet. Activity: Advance as tolerated. Pelvic rest for 6 weeks.  Diet: routine diet Anticipated Birth Control: Unsure Postpartum Appointment:6 weeks Additional Postpartum F/U: none    11/27/2024 [name], MD

## 2024-11-27 NOTE — Progress Notes (Addendum)
 Post Partum Day 2  Subjective: Patient states she is doing well. Dizziness with ambulation is resolved after IV iron  infusion. Notes increased swelling in her lower legs. Pain well controlled. Voiding without difficulty. Lochia appropriate. Breastfeeding and supplementing with bottle   Objective: Blood pressure 118/80, pulse 94, temperature 98.1 F (36.7 C), temperature source Oral, resp. rate 18, height 5' 7 (1.702 m), weight 54.1 kg, SpO2 99%, unknown if currently breastfeeding.  Physical Exam:  General: alert, cooperative, and no distress Lochia: appropriate Uterine Fundus: firm below the umbilicus  DVT Evaluation: No cords or calf tenderness. Mild non-pitting calf/ankle edema.  Recent Labs    11/25/24 0526 11/26/24 0533  HGB 11.5* 8.0*  HCT 35.4* 24.5*    Assessment/Plan: 29 yo G4 now P1132 PPD#2 s/p VBAC in setting of partial placental abruption after presenting with PTL and ROM  1) PP: doing well, s/p lactation consult   2) Partial placental abruption with acute blood loss anemia, clinically significant for this hospitalization: Hb 11-->8. S/p IV iron  on 1/28 will DC home with PO iron   3) Female fetus - Discussed r/b/a of the procedure.  Reviewed that circumcision is an elective surgical procedure and not considered medically necessary.  Reviewed the risks of the procedure including the risk of infection, bleeding, damage to surrounding structures, including scrotum, shaft, urethra and head of penis, and an undesired cosmetic effect requiring additional procedures for revision  Dispo: ,Fetus expected to be observed for 3d, anticipate discharge tomorrow   LOS: 2 days   [name], MD 11/27/2024, 7:26 AM

## 2024-12-02 ENCOUNTER — Telehealth (HOSPITAL_COMMUNITY): Payer: Self-pay

## 2024-12-02 NOTE — Telephone Encounter (Signed)
 12/02/2024 1627  Name: Lindsay Copeland MRN: [id number] DOB: 02/14/1996  Reason for Call:  Transition of Care Hospital Discharge Call  Contact Status: Patient Contact Status: Complete  Language assistant needed:          Follow-Up Questions: Do You Have Any Concerns About Your Health As You Heal From Delivery?: No Do You Have Any Concerns About Your Infants Health?: No  Edinburgh Postnatal Depression Scale:  In the Past 7 Days: I have been able to laugh and see the funny side of things.: As much as I always could I have looked forward with enjoyment to things.: As much as I ever did I have blamed myself unnecessarily when things went wrong.: Not very often I have been anxious or worried for no good reason.: Hardly ever I have felt scared or panicky for no good reason.: No, not at all Things have been getting on top of me.: No, most of the time I have coped quite well I have been so unhappy that I have had difficulty sleeping.: Not at all I have felt sad or miserable.: No, not at all I have been so unhappy that I have been crying.: Only occasionally The thought of harming myself has occurred to me.: Never Edinburgh Postnatal Depression Scale Total: 4  PHQ2-9 Depression Scale:     Discharge Follow-up: Edinburgh score requires follow up?: No Patient was advised of the following resources:: Breastfeeding Support Group, Support Group  Post-discharge interventions: Reviewed Newborn Safe Sleep Practices  Signature  Rosaline Deretha PEAK
